# Patient Record
Sex: Female | Born: 1975
Health system: Southern US, Community
[De-identification: ages and names within clinical notes are randomized; demographics above are authoritative.]

## PROBLEM LIST (undated history)

## (undated) DIAGNOSIS — T7840XA Allergy, unspecified, initial encounter: Secondary | ICD-10-CM

## (undated) DIAGNOSIS — D6851 Activated protein C resistance: Secondary | ICD-10-CM

## (undated) DIAGNOSIS — T4145XA Adverse effect of unspecified anesthetic, initial encounter: Secondary | ICD-10-CM

## (undated) DIAGNOSIS — I7300 Raynaud's syndrome without gangrene: Secondary | ICD-10-CM

## (undated) DIAGNOSIS — F419 Anxiety disorder, unspecified: Secondary | ICD-10-CM

## (undated) DIAGNOSIS — F329 Major depressive disorder, single episode, unspecified: Secondary | ICD-10-CM

## (undated) DIAGNOSIS — F32A Depression, unspecified: Secondary | ICD-10-CM

## (undated) DIAGNOSIS — E162 Hypoglycemia, unspecified: Secondary | ICD-10-CM

## (undated) DIAGNOSIS — T8859XA Other complications of anesthesia, initial encounter: Secondary | ICD-10-CM

## (undated) DIAGNOSIS — K900 Celiac disease: Secondary | ICD-10-CM

## (undated) DIAGNOSIS — F909 Attention-deficit hyperactivity disorder, unspecified type: Secondary | ICD-10-CM

## (undated) DIAGNOSIS — M797 Fibromyalgia: Secondary | ICD-10-CM

## (undated) HISTORY — PX: FOOT SURGERY: SHX648

## (undated) HISTORY — DX: Raynaud's syndrome without gangrene: I73.00

## (undated) HISTORY — DX: Allergy, unspecified, initial encounter: T78.40XA

## (undated) HISTORY — DX: Celiac disease: K90.0

## (undated) HISTORY — DX: Anxiety disorder, unspecified: F41.9

## (undated) HISTORY — DX: Activated protein C resistance: D68.51

## (undated) HISTORY — DX: Depression, unspecified: F32.A

## (undated) HISTORY — DX: Major depressive disorder, single episode, unspecified: F32.9

## (undated) HISTORY — DX: Attention-deficit hyperactivity disorder, unspecified type: F90.9

## (undated) HISTORY — DX: Fibromyalgia: M79.7

## (undated) HISTORY — DX: Hypoglycemia, unspecified: E16.2

---

## 2008-03-06 ENCOUNTER — Ambulatory Visit: Payer: Self-pay | Admitting: Oncology

## 2008-03-08 ENCOUNTER — Ambulatory Visit: Payer: Self-pay | Admitting: Oncology

## 2008-03-08 IMAGING — US US EXTREM LOW VENOUS*R*
1 series · 17 of 24 positions shown · non-contrast
Comparison: none

REASON FOR EXAM: Right leg pain, history of DVT
           Call report to: [[email or web address]]
COMMENTS:

[Series 1: us extrem low venous*right* · 17 of 26 slices shown]
[im 1/26]
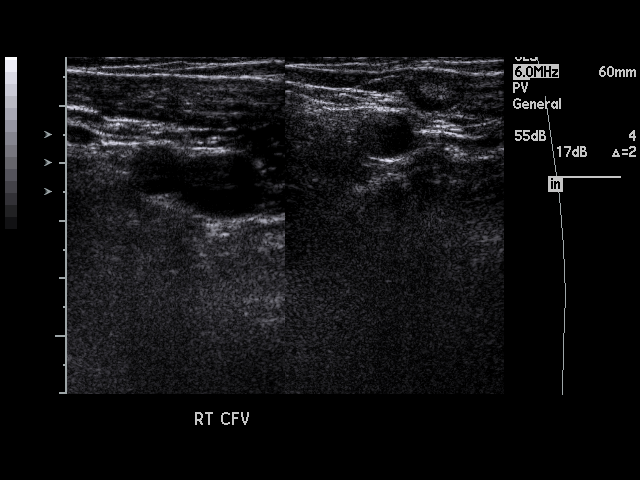
[im 3/26]
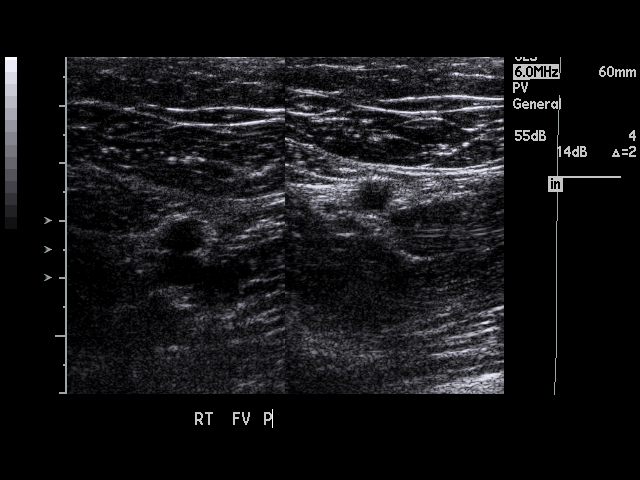
[im 4/26]
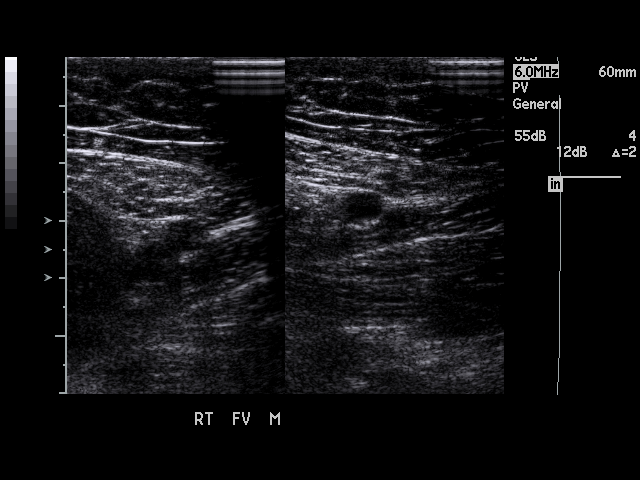
[im 5/26]
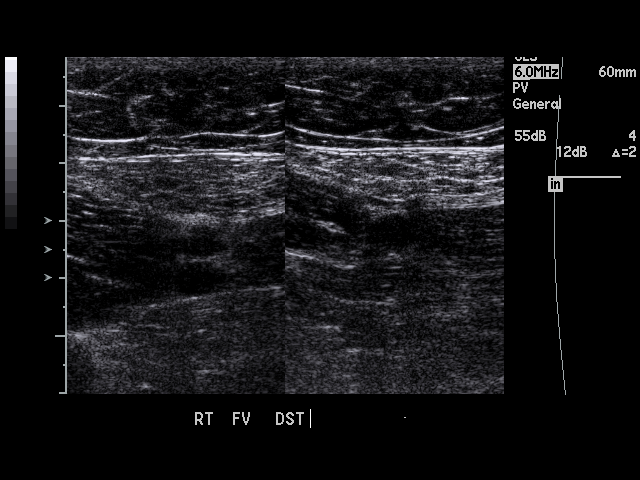
[im 7/26]
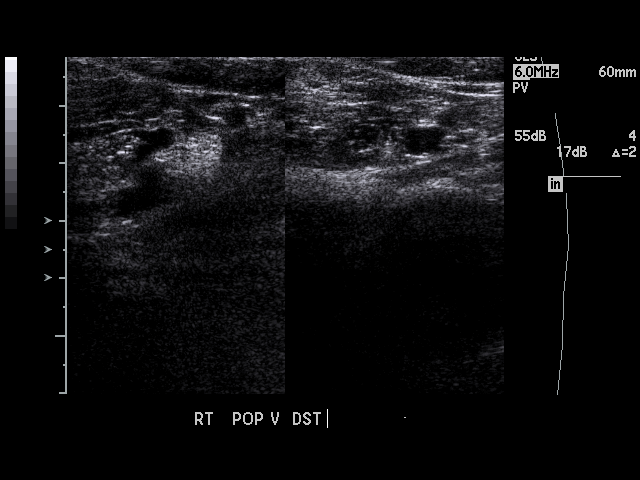
[im 8/26]
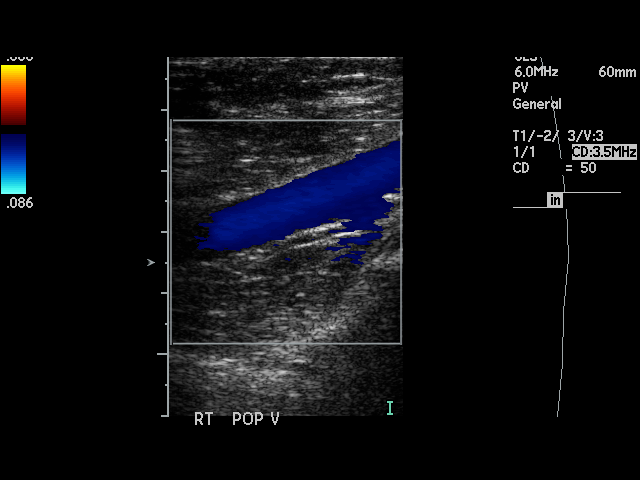
[im 10/26]
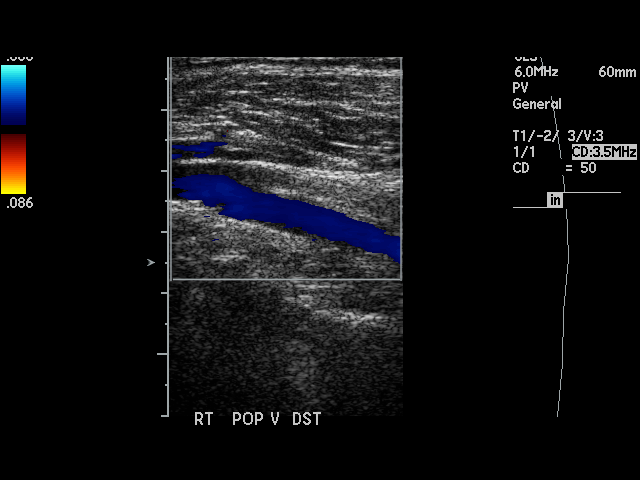
[im 11/26]
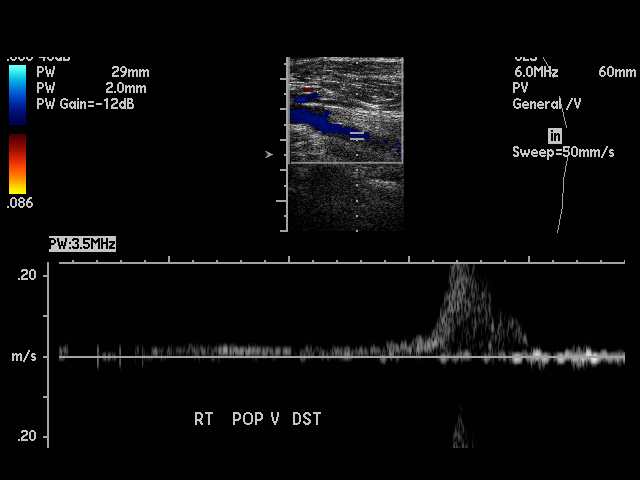
[im 14/26]
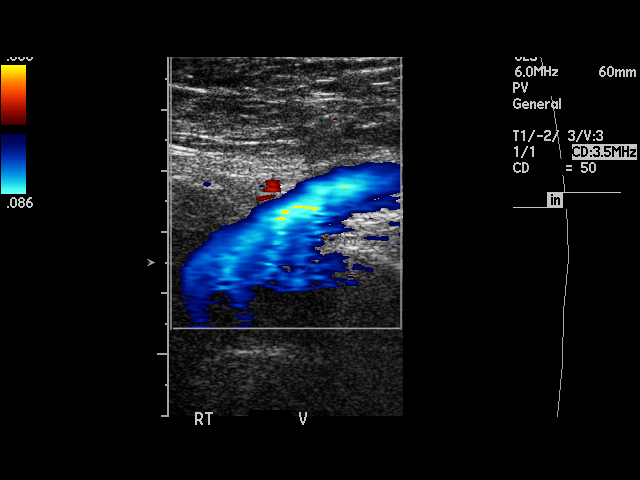
[im 15/26]
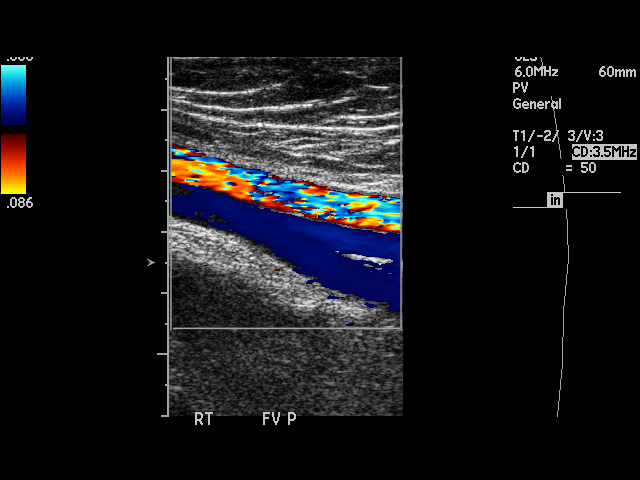
[im 16/26]
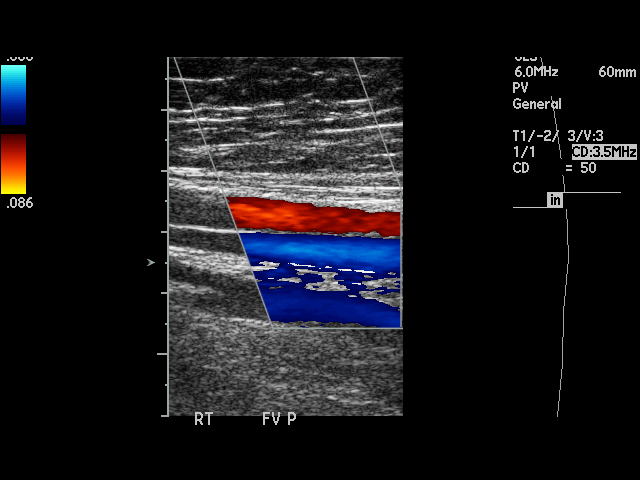
[im 18/26]
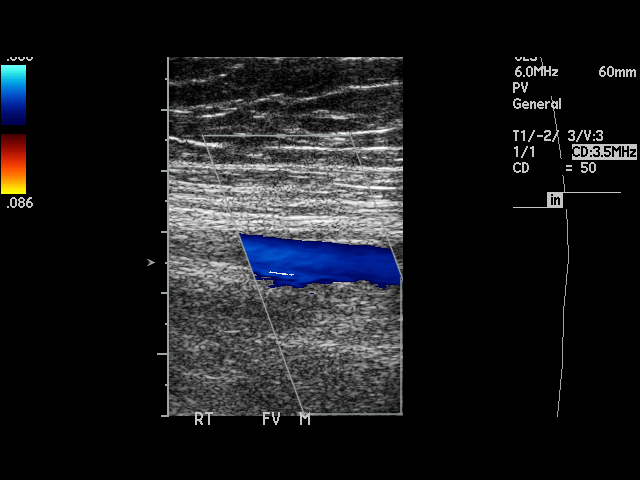
[im 19/26]
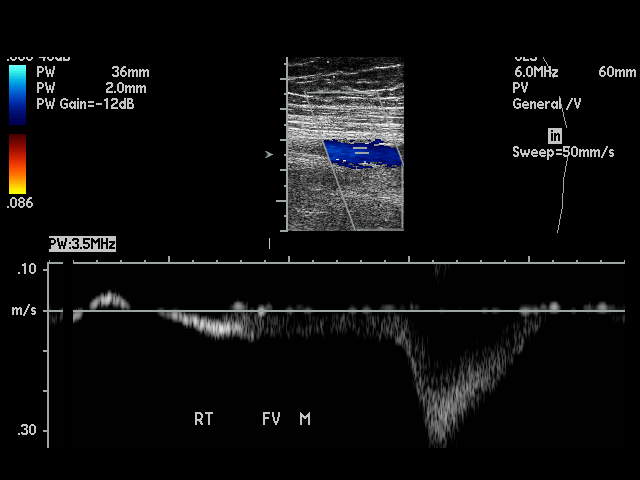
[im 21/26]
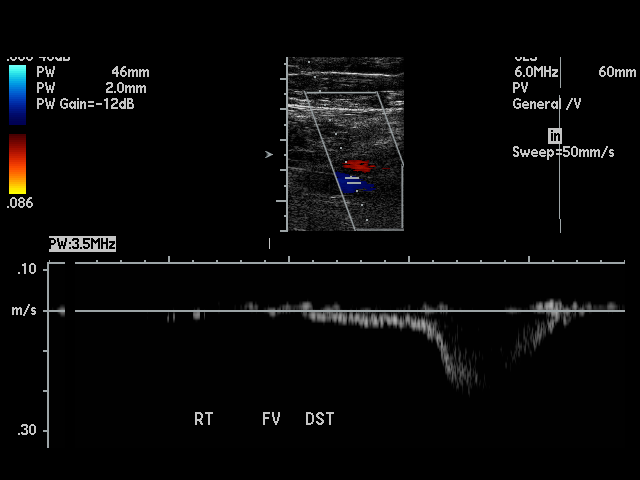
[im 22/26]
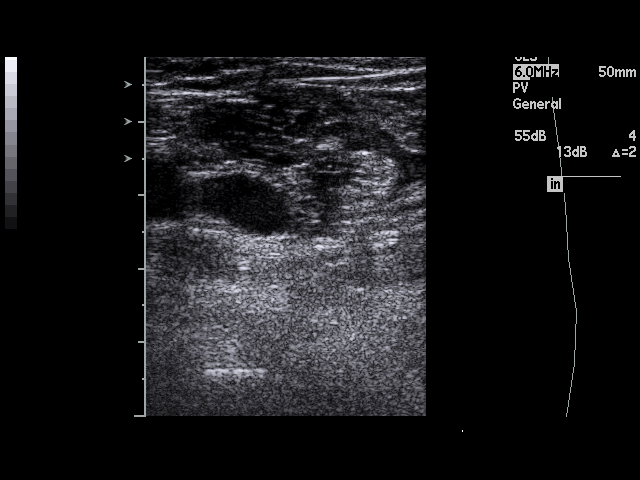
[im 23/26]
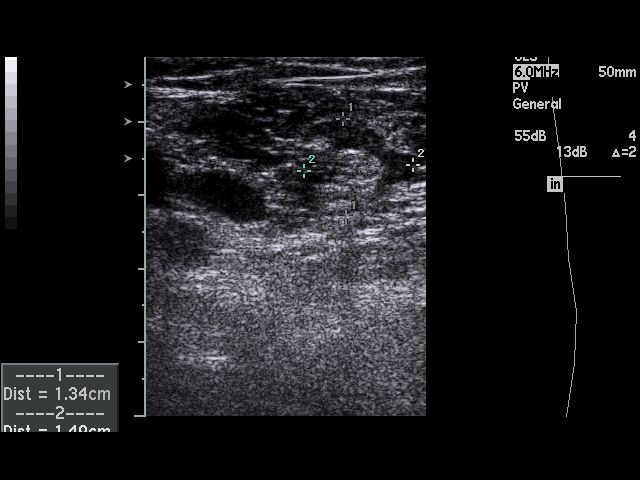
[im 26/26]
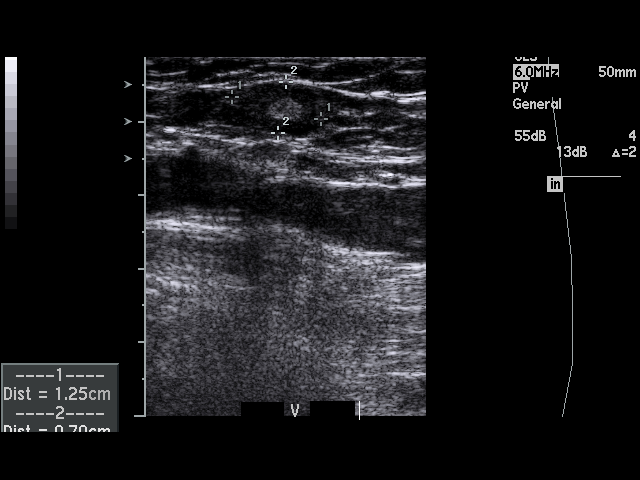

[17 of 24 positions shown; findings below may reference images not displayed]

PROCEDURE:     US  - US DOPPLER LOW EXTR RIGHT  - [DATE]  [DATE]

RESULT:     The RIGHT femoral and popliteal veins are normally compressible.
The waveform patterns are normal and the color flow images are normal. There
are borderline enlarged lymph nodes noted adjacent to the common femoral and
saphenous veins with the largest measuring approximately 2.0 cm in diameter.
IMPRESSION: 1.     There is no evidence of deep venous thrombosis on the RIGHT in the
lower extremity.
2.     There are borderline enlarged lymph nodes identified.

This report was called to Dr. [REDACTED] at the conclusion of the
study.

## 2008-04-02 ENCOUNTER — Ambulatory Visit: Payer: Self-pay | Admitting: Oncology

## 2008-08-30 ENCOUNTER — Ambulatory Visit: Payer: Self-pay | Admitting: Oncology

## 2008-09-02 ENCOUNTER — Ambulatory Visit: Payer: Self-pay | Admitting: Oncology

## 2008-09-13 ENCOUNTER — Ambulatory Visit: Payer: Self-pay | Admitting: Oncology

## 2008-09-30 ENCOUNTER — Ambulatory Visit: Payer: Self-pay | Admitting: Oncology

## 2008-12-31 ENCOUNTER — Ambulatory Visit: Payer: Self-pay | Admitting: Internal Medicine

## 2009-01-20 ENCOUNTER — Ambulatory Visit: Payer: Self-pay | Admitting: Internal Medicine

## 2009-01-22 IMAGING — US ABDOMEN ULTRASOUND
1 series · 17 of 25 positions shown · non-contrast
Comparison: none

REASON FOR EXAM: severe neutropenia
COMMENTS:

PROCEDURE:     [facility] - K[facility]MA ABDOMEN GENERAL SURVEY  - [DATE]  [DATE]
RESULT:     Comparison: None
TECHNIQUE: Multiple gray-scale and color-flow Doppler images of the abdomen
are presented for review.

[Series 1: abdomen ultrasound · 17 of 77 slices shown]
[im 1/77]
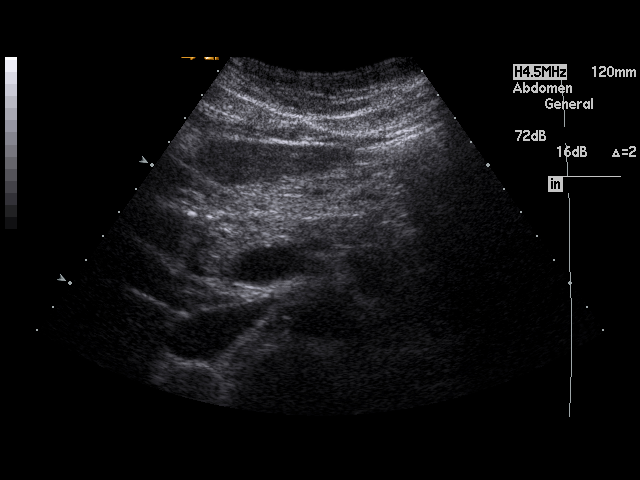
[im 7/77]
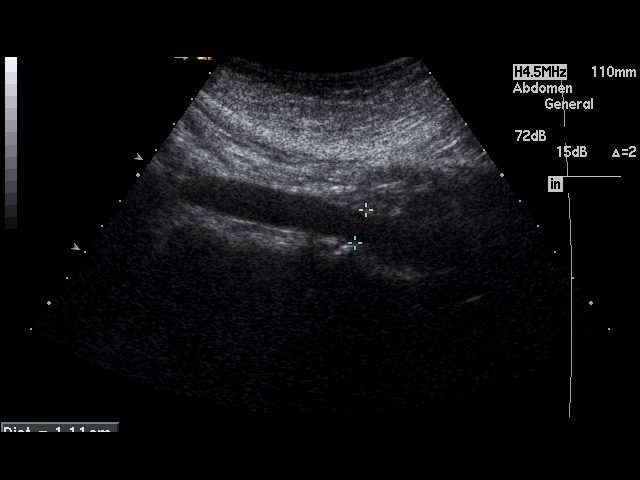
[im 10/77]
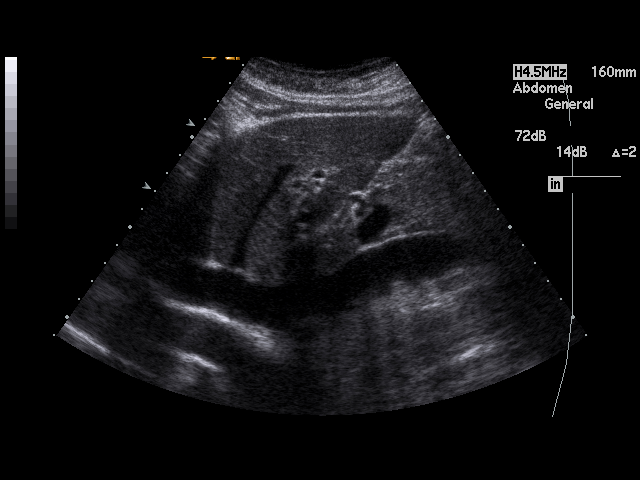
[im 16/77]
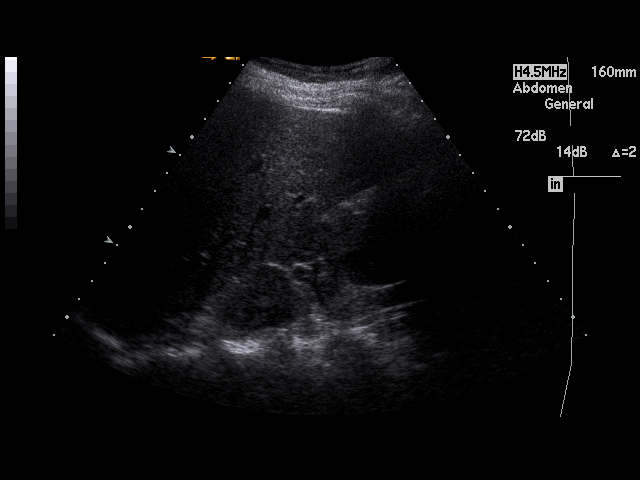
[im 20/77]
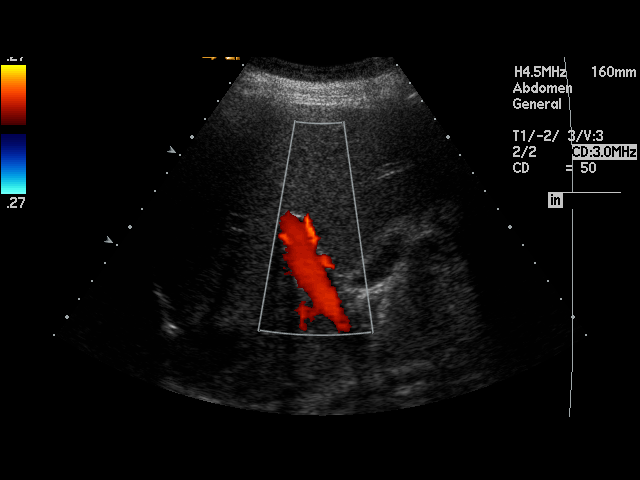
[im 26/77]
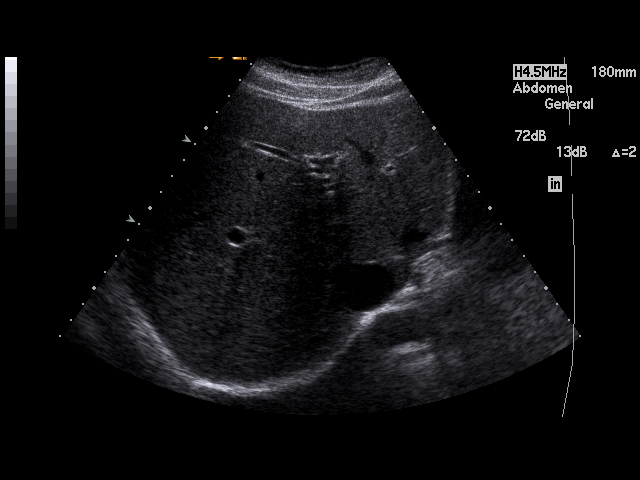
[im 29/77]
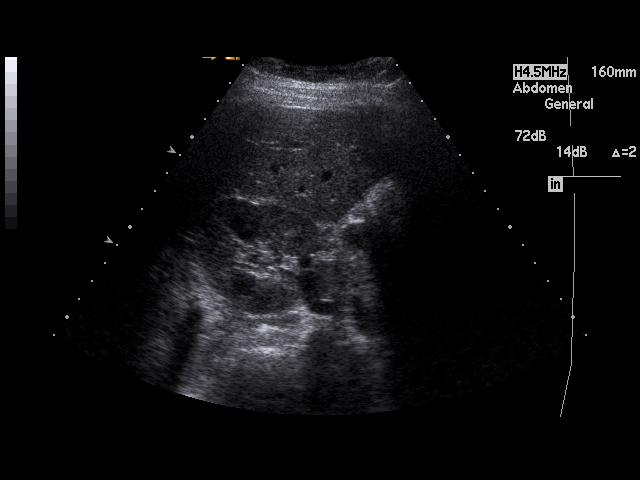
[im 35/77]
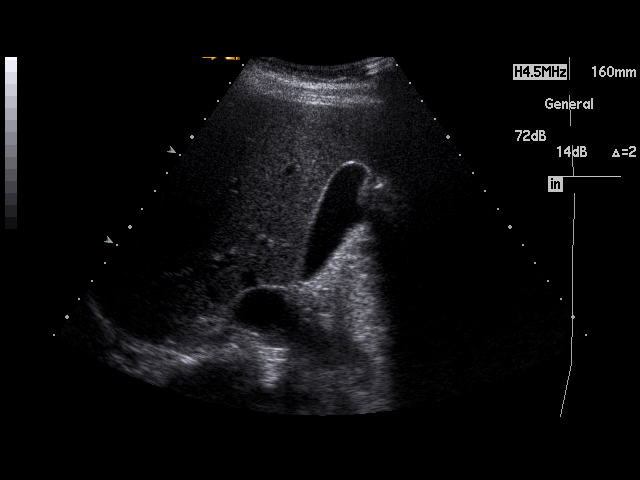
[im 39/77]
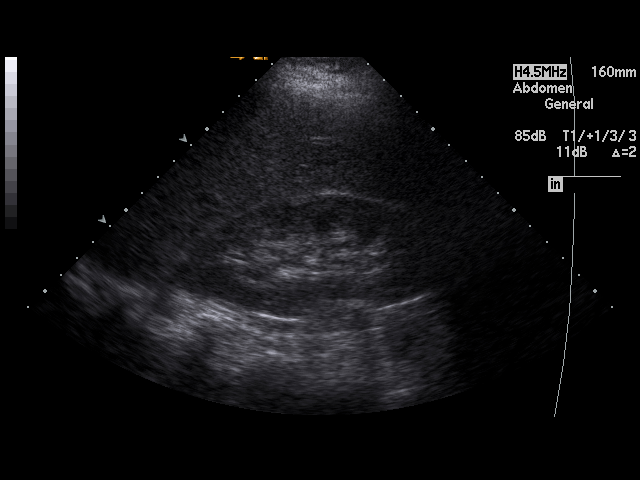
[im 42/77]
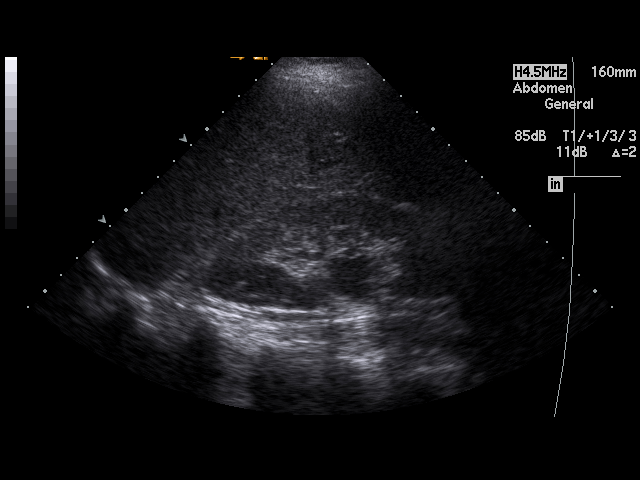
[im 48/77]
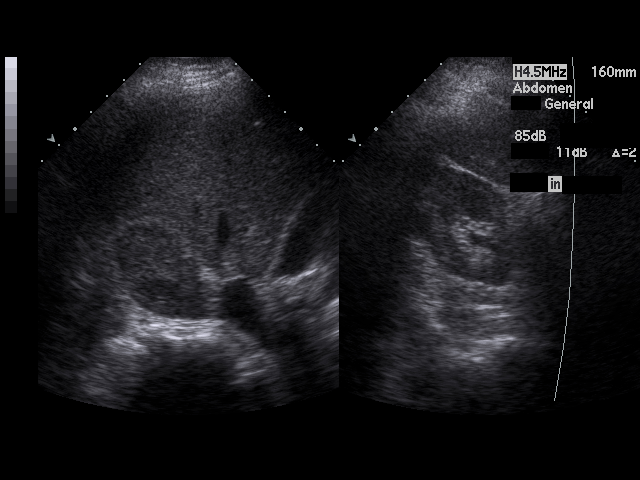
[im 51/77]
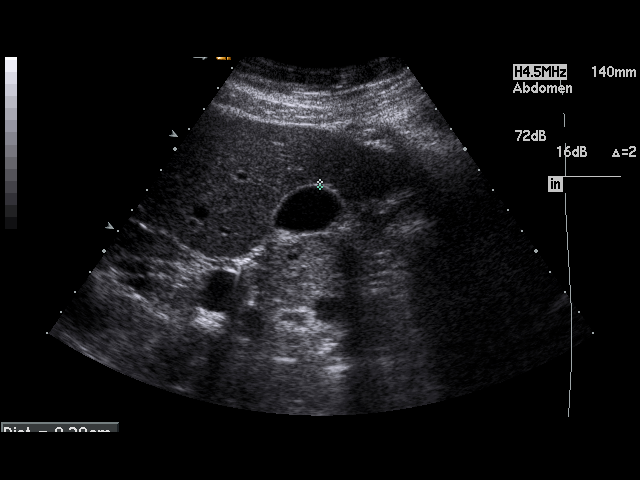
[im 58/77]
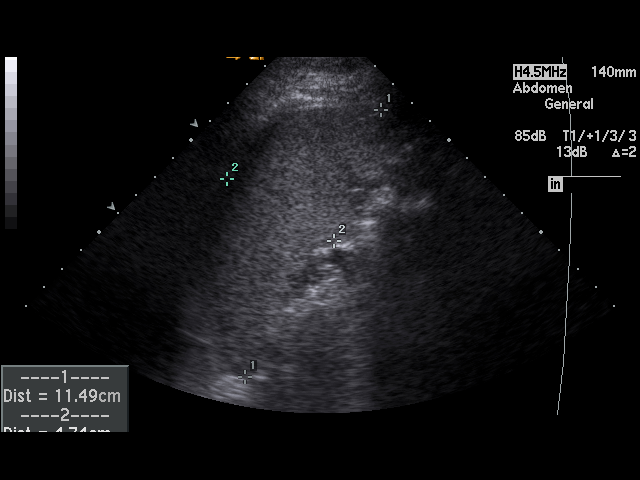
[im 61/77]
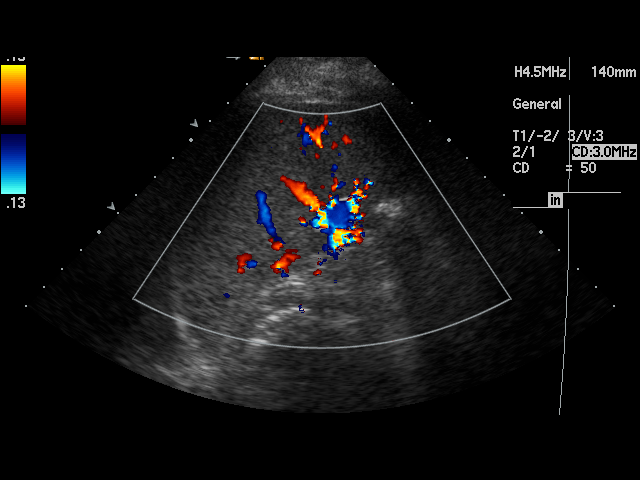
[im 67/77]
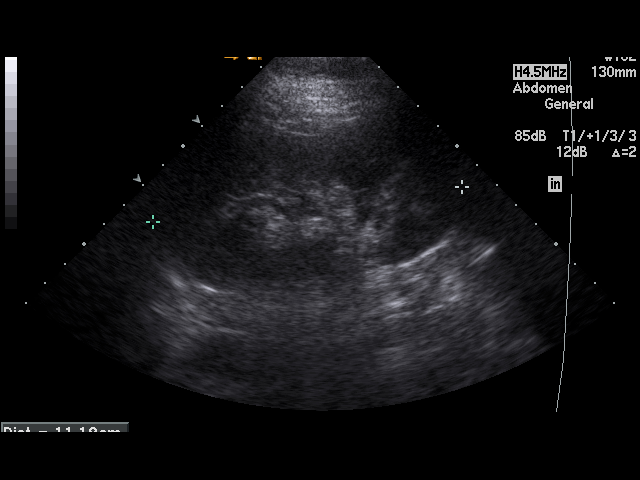
[im 70/77]
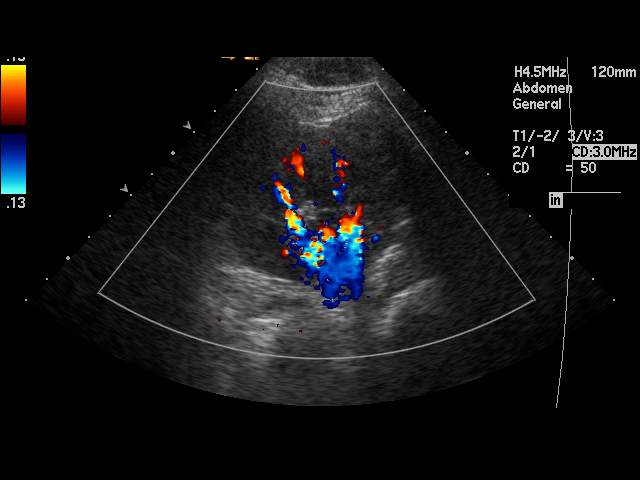
[im 77/77]
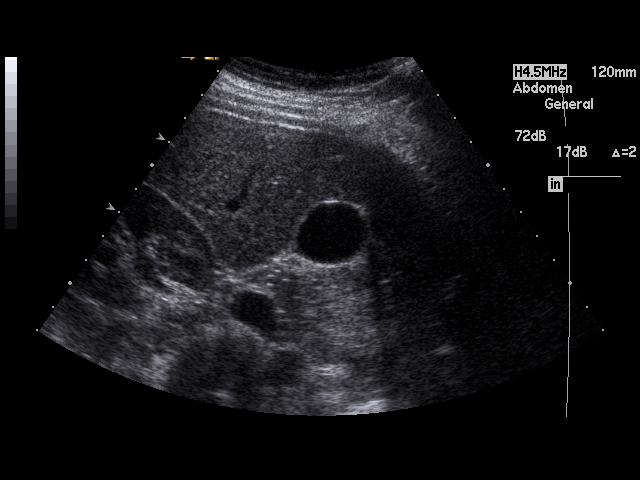

[17 of 25 positions shown; findings below may reference images not displayed]

FINDINGS: Visualized portions of the liver demonstrate normal echogenicity and normal
contours. The liver is without evidence of  focal hepatic lesion.

There is no cholelithiasis or biliary sludge. There is no intra- or
extrahepatic biliary ductal dilatation. The common duct measures 4.9 mm in
maximal diameter. There is no gallbladder wall thickening, pericholecystic
fluid, or sonographic Murphy's sign.

The visualized portion of the pancreas is normal in echogenicity. The spleen
is unremarkable. Bilateral kidneys are normal in echogenicity and size. The
right kidney measures 12.4 cm. The left kidney measures 11.2 cm. There are
no renal calculi or hydronephrosis. The abdominal aorta and IVC are
unremarkable.
IMPRESSION: Normal abdominal ultrasound.

## 2009-01-30 ENCOUNTER — Ambulatory Visit: Payer: Self-pay | Admitting: Internal Medicine

## 2009-03-02 ENCOUNTER — Ambulatory Visit: Payer: Self-pay | Admitting: Internal Medicine

## 2009-03-10 ENCOUNTER — Ambulatory Visit: Payer: Self-pay | Admitting: Internal Medicine

## 2009-04-02 ENCOUNTER — Ambulatory Visit: Payer: Self-pay | Admitting: Internal Medicine

## 2009-04-15 ENCOUNTER — Ambulatory Visit: Payer: Self-pay | Admitting: Internal Medicine

## 2009-05-02 ENCOUNTER — Ambulatory Visit: Payer: Self-pay | Admitting: Internal Medicine

## 2012-01-21 DIAGNOSIS — D6851 Activated protein C resistance: Secondary | ICD-10-CM | POA: Insufficient documentation

## 2013-06-22 ENCOUNTER — Ambulatory Visit: Payer: Self-pay | Admitting: Internal Medicine

## 2013-07-12 ENCOUNTER — Ambulatory Visit: Payer: Self-pay | Admitting: Internal Medicine

## 2013-08-01 ENCOUNTER — Encounter: Payer: Self-pay | Admitting: *Deleted

## 2013-08-08 ENCOUNTER — Ambulatory Visit: Payer: Self-pay | Admitting: Internal Medicine

## 2013-08-08 ENCOUNTER — Telehealth: Payer: Self-pay | Admitting: *Deleted

## 2013-08-08 NOTE — Telephone Encounter (Signed)
called to see if she was coming to appt. she forgot. she will reschedule.

## 2013-08-30 DIAGNOSIS — R1310 Dysphagia, unspecified: Secondary | ICD-10-CM | POA: Insufficient documentation

## 2013-08-30 DIAGNOSIS — R634 Abnormal weight loss: Secondary | ICD-10-CM | POA: Insufficient documentation

## 2013-08-30 DIAGNOSIS — R1319 Other dysphagia: Secondary | ICD-10-CM | POA: Insufficient documentation

## 2013-09-06 ENCOUNTER — Ambulatory Visit: Payer: Self-pay | Admitting: Internal Medicine

## 2015-02-25 ENCOUNTER — Encounter: Payer: Self-pay | Admitting: Family Medicine

## 2015-02-25 NOTE — Progress Notes (Signed)
Patient was last seen at [facility] September 11, 2014 She should have been seen for f/u 12 weeks later [facility] reviewed Last clonazepam was filled on Dec 13, 2014, #42 pills She will need an appointment for any further controlled substance prescriptions Dr. Sanda Klein

## 2015-03-07 ENCOUNTER — Ambulatory Visit (INDEPENDENT_AMBULATORY_CARE_PROVIDER_SITE_OTHER): Payer: Self-pay | Admitting: Family Medicine

## 2015-03-07 ENCOUNTER — Encounter: Payer: Self-pay | Admitting: Family Medicine

## 2015-03-07 VITALS — BP 111/75 | HR 84 | Temp 99.4°F | Ht 67.5 in | Wt 138.4 lb

## 2015-03-07 DIAGNOSIS — H6091 Unspecified otitis externa, right ear: Secondary | ICD-10-CM

## 2015-03-07 DIAGNOSIS — H9311 Tinnitus, right ear: Secondary | ICD-10-CM

## 2015-03-07 DIAGNOSIS — F411 Generalized anxiety disorder: Secondary | ICD-10-CM

## 2015-03-07 MED ORDER — CIPROFLOXACIN-DEXAMETHASONE 0.3-0.1 % OT SUSP: 4.0000 [drp] | Freq: Two times a day (BID) | OTIC | Status: DC

## 2015-03-07 MED ORDER — CLONAZEPAM 1 MG PO TABS: 1.0000 mg | ORAL_TABLET | ORAL | Status: DC | PRN

## 2015-03-07 MED ORDER — NEOMYCIN-POLYMYXIN-HC 3.5-10000-1 OT SOLN: 4.0000 [drp] | Freq: Four times a day (QID) | OTIC | Status: DC

## 2015-03-07 NOTE — Progress Notes (Signed)
BP 111/75 mmHg  Pulse 84  Temp(Src) 99.4 F (37.4 C)  Ht 5' 7.5" (1.715 m)  Wt 138 lb 6.4 oz (62.778 kg)  BMI 21.34 kg/m2  SpO2 99%  LMP 02/16/2015 (Exact Date)   Subjective:    Patient ID: Diana Daniels, female    DOB: 1975-12-30, 39 y.o.   MRN: [id number]  HPI: Diana Daniels is a 39 y.o. female  Chief Complaint  Patient presents with  . Anxiety    Patient would like her Klonopin increased back to two a day  . Ear Pain    right ear, muffled sound and pitch. Was cleaning her ear before this started and there was a lot of blood, she did not jam the Qtip into her ear. Has a hx of rutured ear drum 20 years ago   EAG CLOGGED, has been having a high pitched noise in her ear, constant for the last week and a half Duration: 1.5 weeks Involved ear(s):  "right Sensation of feeling clogged/plugged: yes Decreased/muffled hearing:yes Ear pain: yes only occasionally Fever: no Otorrhea: yes Hearing loss: no Upper respiratory infection symptoms: no Using Q-Tips: yes Status: worse History of cerumenosis: yes Treatments attempted: pseudoephedrine, benadryl  Feels like she is always anxious. She going to a psychologist. She is going to see a psychiatrist but her appointment is several weeks off. She has been very anxious and it gets worse when she comes here, especially seeing her provider.  ANXIETY/STRESS Duration:uncontrolled, chronic Anxious mood: yes  Excessive worrying: yes Irritability: no  Sweating: yes Nausea: yes Palpitations:yes Hyperventilation: yes Panic attacks: yes Agoraphobia: yes  Obscessions/compulsions: yes Depressed mood: no GAD7: 20  Relevant past medical, surgical, family and social history reviewed and updated as indicated. Interim medical history since our last visit reviewed. Allergies and medications reviewed and updated.  Review of Systems  Constitutional: Negative.   HENT: Positive for ear discharge, ear pain and hearing loss. Negative for  congestion, dental problem, drooling, facial swelling, mouth sores, nosebleeds, postnasal drip, rhinorrhea, sinus pressure, sneezing, sore throat, tinnitus, trouble swallowing and voice change.   Respiratory: Negative.   Cardiovascular: Negative.   Psychiatric/Behavioral: Negative for suicidal ideas, hallucinations, behavioral problems, confusion, sleep disturbance, self-injury, dysphoric mood, decreased concentration and agitation. The patient is nervous/anxious. The patient is not hyperactive.     Per HPI unless specifically indicated above     Objective:    BP 111/75 mmHg  Pulse 84  Temp(Src) 99.4 F (37.4 C)  Ht 5' 7.5" (1.715 m)  Wt 138 lb 6.4 oz (62.778 kg)  BMI 21.34 kg/m2  SpO2 99%  LMP 02/16/2015 (Exact Date)  Wt Readings from Last 3 Encounters:  03/07/15 138 lb 6.4 oz (62.778 kg)    Hearing Screening   125Hz  250Hz  500Hz  1000Hz  2000Hz  4000Hz  8000Hz   Right ear:   20 20 20 20    Left ear:   25 25 25 25        Physical Exam  Constitutional: She is oriented to person, place, and time. She appears well-developed and well-nourished. No distress.  HENT:  Head: Normocephalic and atraumatic.  Right Ear: Hearing normal. No lacerations. There is swelling. No drainage or tenderness. No foreign bodies. No mastoid tenderness. Tympanic membrane is not injected. Tympanic membrane mobility is normal. No middle ear effusion. No hemotympanum. No decreased hearing is noted.  Left Ear: Hearing and external ear normal.  Nose: Nose normal.  Mouth/Throat: Oropharynx is clear and moist. No oropharyngeal exudate.  Swelling and some pus in R  EAC  Eyes: Conjunctivae and lids are normal. Right eye exhibits no discharge. Left eye exhibits no discharge. No scleral icterus.  Cardiovascular: Normal rate, regular rhythm and normal heart sounds.  Exam reveals no gallop and no friction rub.   No murmur heard. Pulmonary/Chest: Effort normal and breath sounds normal. No respiratory distress. She has no  wheezes. She has no rales. She exhibits no tenderness.  Musculoskeletal: Normal range of motion.  Neurological: She is alert and oriented to person, place, and time.  Skin: Skin is intact. No rash noted.  Psychiatric: She has a normal mood and affect. Her speech is normal and behavior is normal. Judgment and thought content normal. Cognition and memory are normal.  Nursing note and vitals reviewed.   No results found for this or any previous visit.    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 Stable on her medicine, has been halving it to make it last. Uncomfortable seeing previous provider, feels like it increases her anxiety. Will return to other provider. Will refill medicine for 1 month. Rx given today. Continue to follow with specialists.        Other Visit Diagnoses    Otitis externa, right    -  Primary    Rx for ciprodex given today. Call if not improving or worsening.     Tinnitus, right        Possibly due to otitis externa. Will treat. If not improving, let us know, will do further work up.         Follow up plan: Return in about 4 weeks (around 04/04/2015) for With [facility] for follow up anxiety.

## 2015-03-07 NOTE — Patient Instructions (Signed)
Tinnitus °Sounds you hear in your ears and coming from within the ear is called tinnitus. This can be a symptom of many ear disorders. It is often associated with hearing loss.  °Tinnitus can be seen with: °· Infections. °· Ear blockages such as wax buildup. °· Meniere's disease. °· Ear damage. °· Inherited. °· Occupational causes. °While irritating, it is not usually a threat to health. When the cause of the tinnitus is wax, infection in the middle ear, or foreign body it is easily treated. Hearing loss will usually be reversible.  °TREATMENT  °When treating the underlying cause does not get rid of tinnitus, it may be necessary to get rid of the unwanted sound by covering it up with more pleasant background noises. This may include music, the radio etc. There are tinnitus maskers which can be worn which produce background noise to cover up the tinnitus. °Avoid all medications which tend to make tinnitus worse such as alcohol, caffeine, aspirin, and nicotine. There are many soothing background tapes such as rain, ocean, thunderstorms, etc. These soothing sounds help with sleeping or resting. °Keep all follow-up appointments and referrals. This is important to identify the cause of the problem. It also helps avoid complications, impaired hearing, disability, or chronic pain. °Document Released: 07/19/2005 Document Revised: 10/11/2011 Document Reviewed: 03/06/2008 °ExitCare® Patient Information ©2015 [employer]. This information is not intended to replace advice given to you by your health care provider. Make sure you discuss any questions you have with your health care provider. ° °

## 2015-03-07 NOTE — Addendum Note (Signed)
Addended by: Valerie Roys on: 03/07/2015 02:09 PM   Modules accepted: Orders, Medications

## 2015-03-07 NOTE — Assessment & Plan Note (Addendum)
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 Stable on her medicine, has been halving it to make it last. Uncomfortable seeing previous provider, feels like it increases her anxiety. Will return to other provider. Will refill medicine for 1 month. Rx given today. Continue to follow with specialists.

## 2015-04-04 ENCOUNTER — Ambulatory Visit: Payer: Self-pay | Admitting: Family Medicine

## 2015-11-17 IMAGING — US US ABDOMEN COMPLETE
1 series · 14 of 25 positions shown · non-contrast
Comparison: Ultrasound [DATE] .

CLINICAL DATA: Leukopenia.

EXAM:
ABDOMEN ULTRASOUND COMPLETE

[Series 1: us abdomen complete · 0.20mm/px · 14 of 114 slices shown]
[im 1/114]
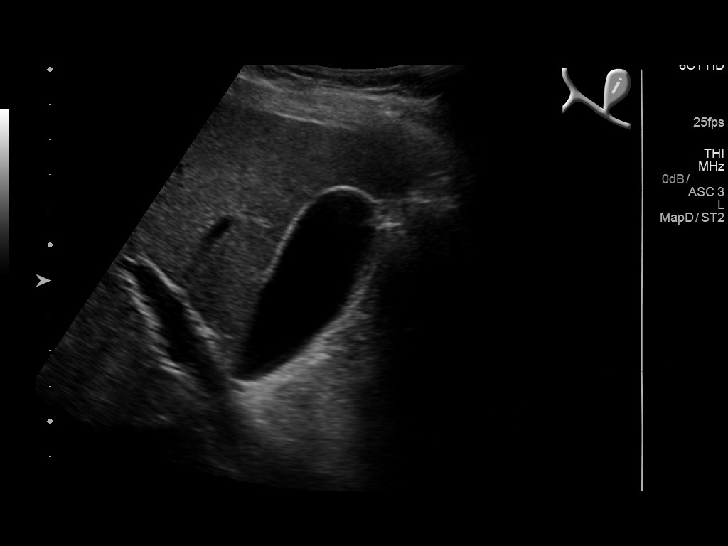
[im 10/114]
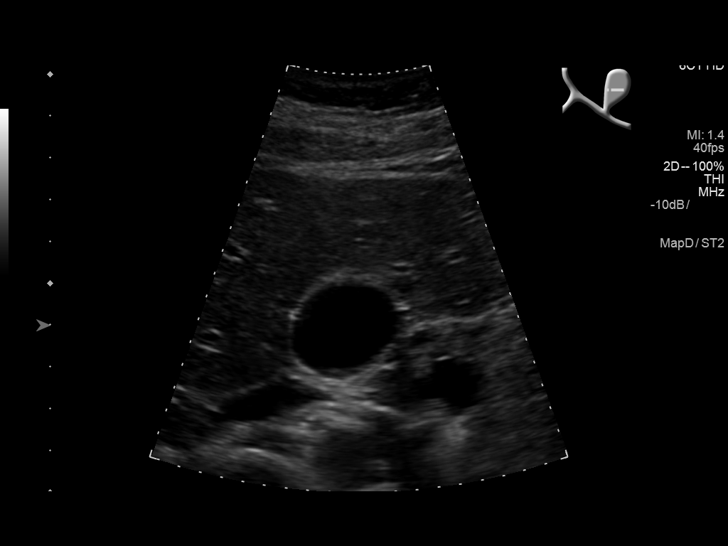
[im 19/114]
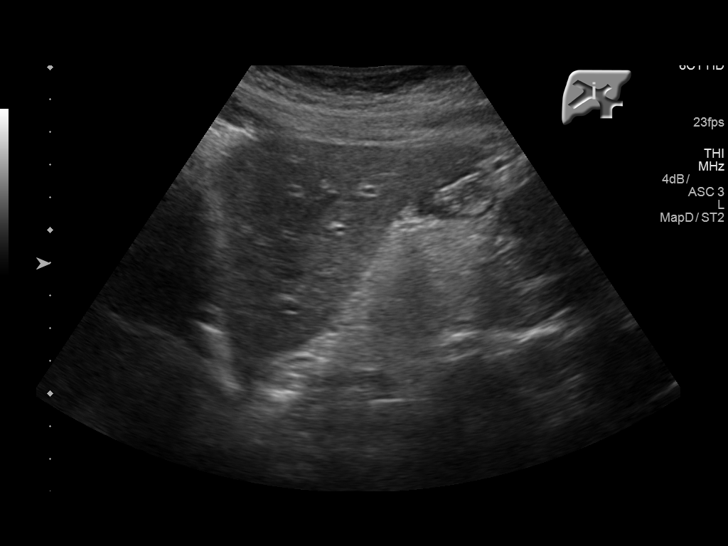
[im 29/114]
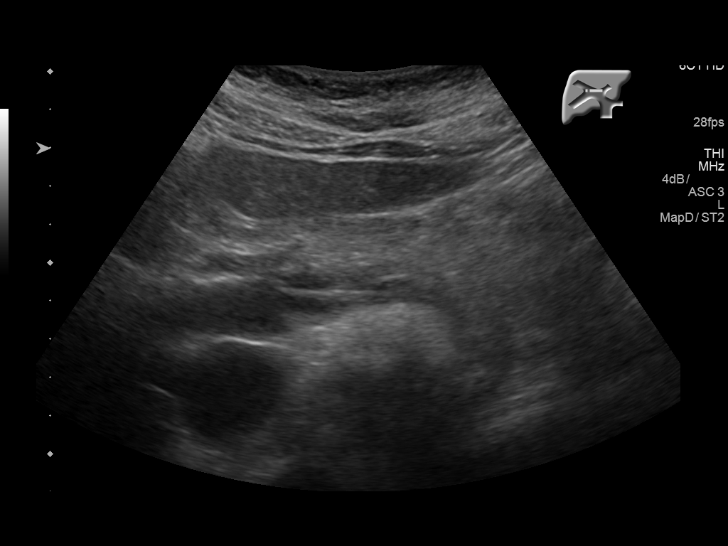
[im 38/114]
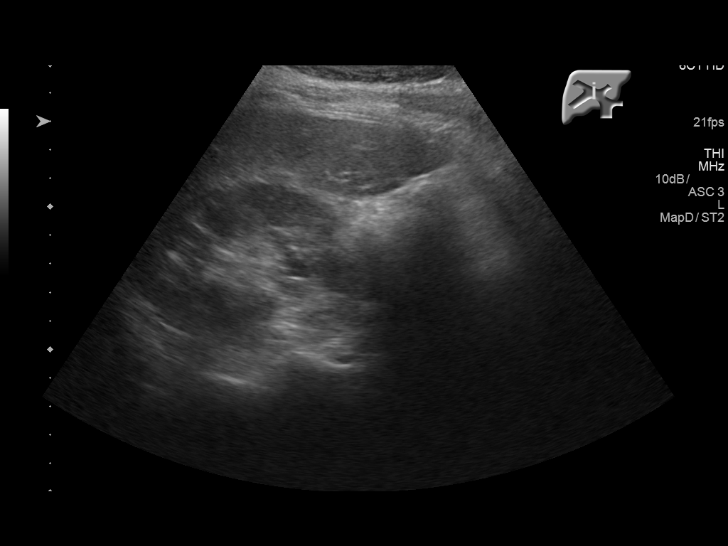
[im 43/114]
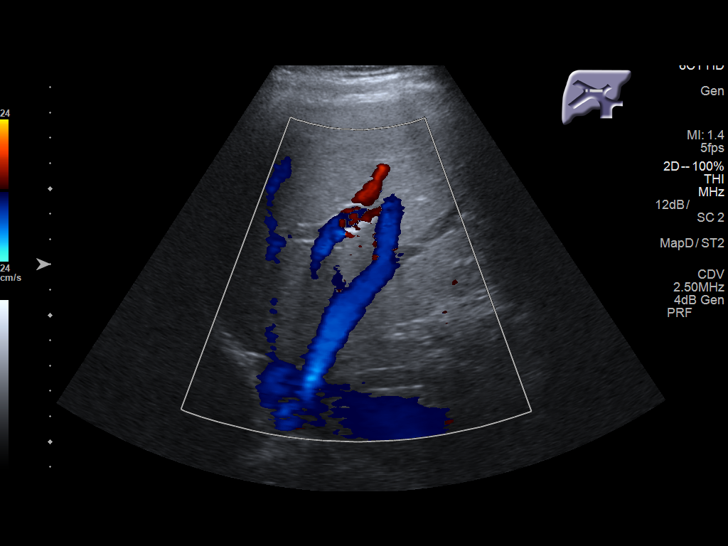
[im 52/114]
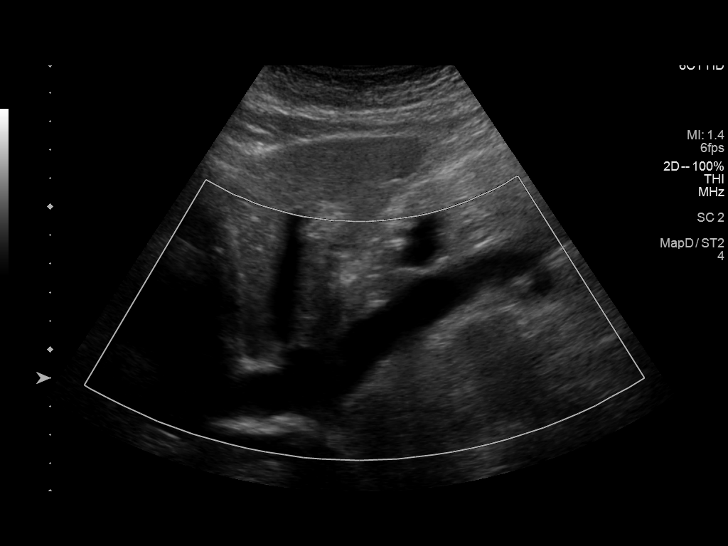
[im 62/114]
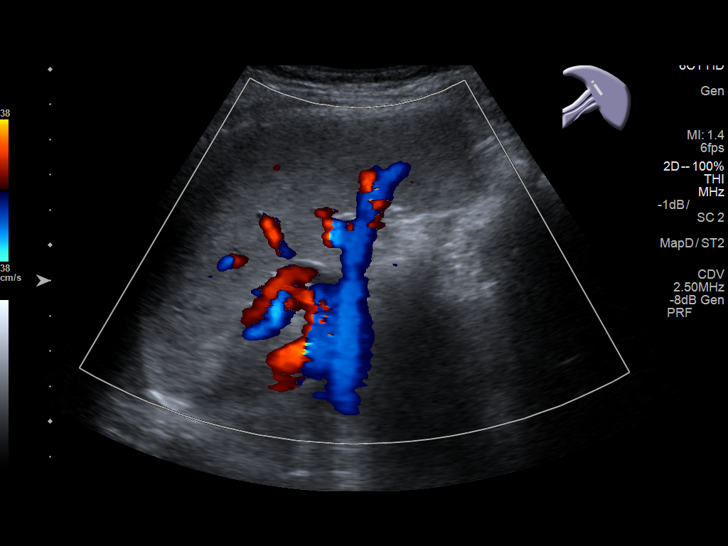
[im 71/114]
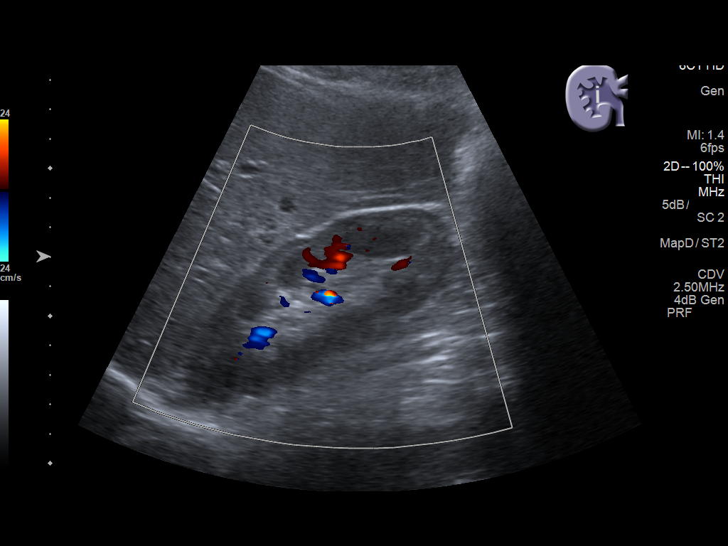
[im 76/114]
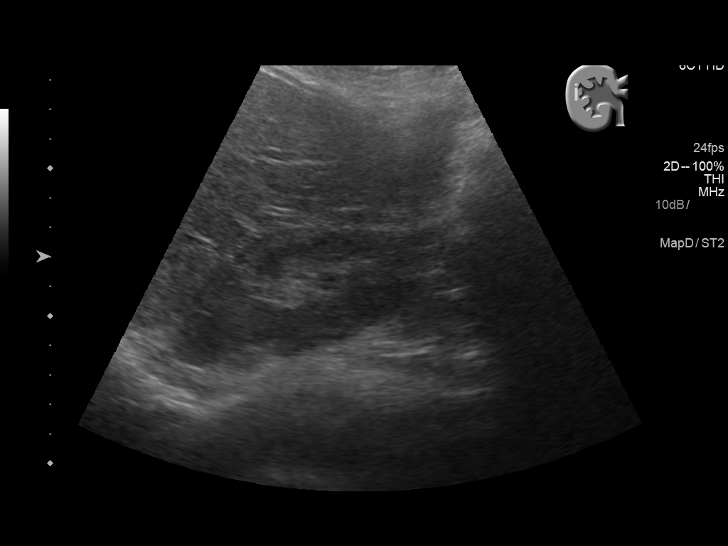
[im 85/114]
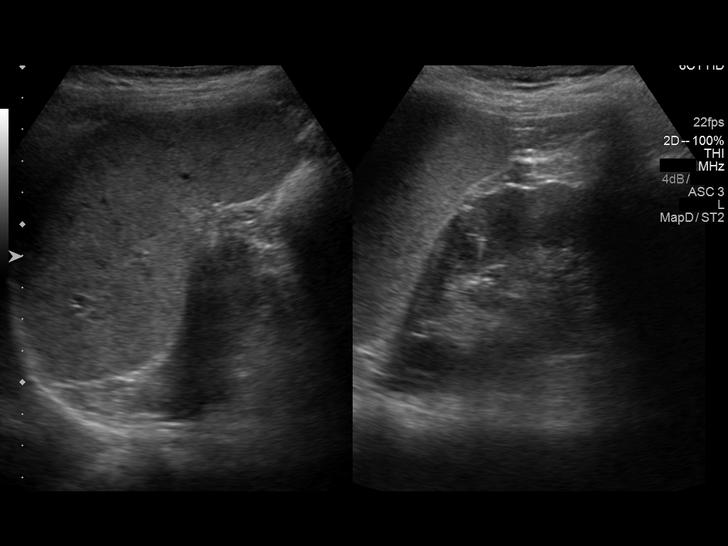
[im 95/114]
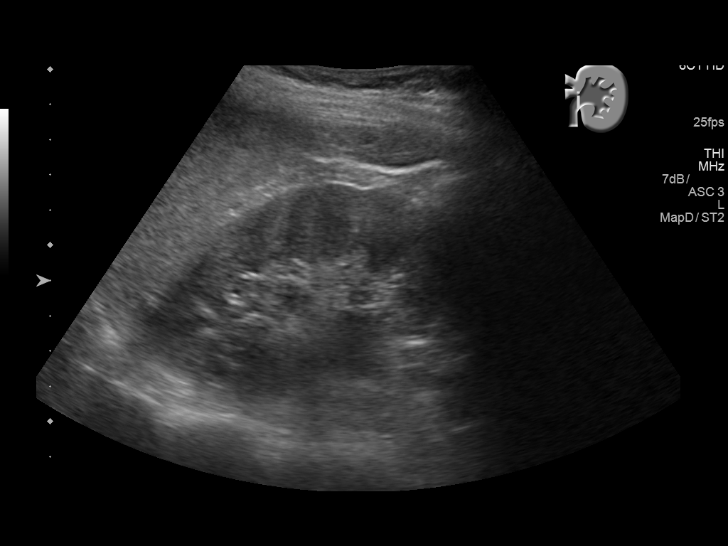
[im 104/114]
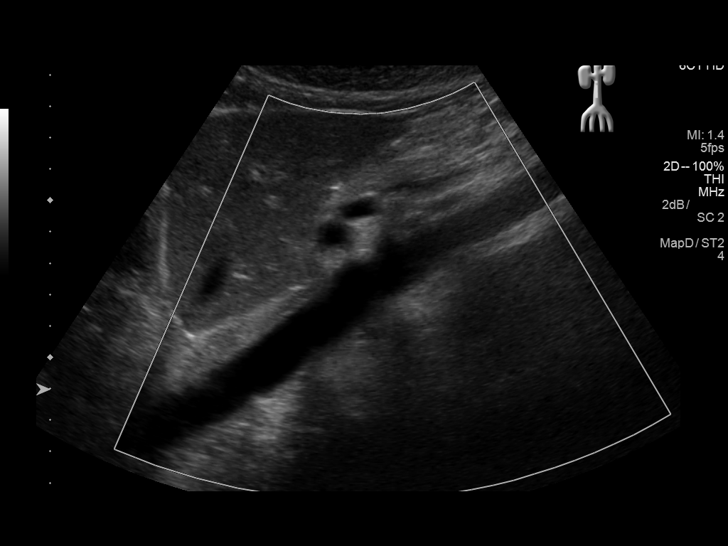
[im 114/114]
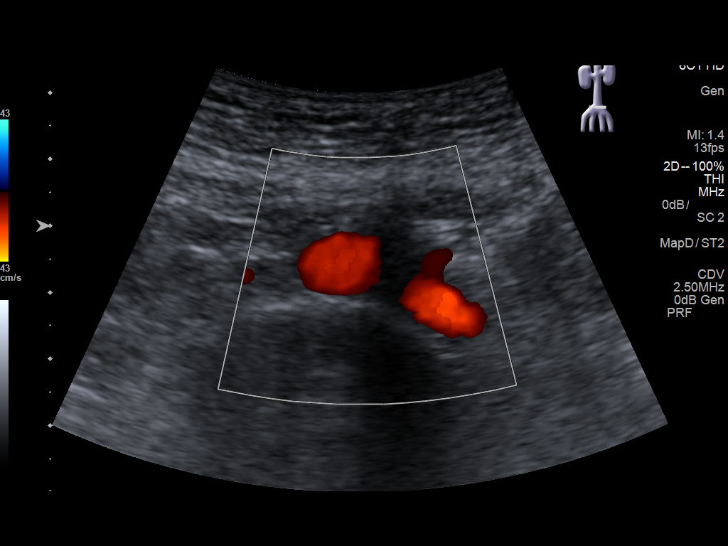

[14 of 25 positions shown; findings below may reference images not displayed]

FINDINGS: Gallbladder: No gallstones or wall thickening visualized. No
sonographic Murphy sign noted by sonographer.

Common bile duct: Diameter: 4 mm

Liver: No focal lesion identified. Within normal limits in
parenchymal echogenicity.

IVC: No abnormality visualized.

Pancreas: Visualized portion unremarkable.

Spleen: Size and appearance within normal limits. Spleen measures
9.4 cm.

Right Kidney: Length: 10.9 cm. Echogenicity within normal limits. No
mass or hydronephrosis visualized.

Left Kidney: Length: 10.3 cm. Echogenicity within normal limits. No
mass or hydronephrosis visualized.

Abdominal aorta: No aneurysm visualized.

Other findings: None.
IMPRESSION: No acute or focal abnormality identified. Spleen measures 9.4 cm. No
focal splenic abnormality identified.

## 2016-02-02 ENCOUNTER — Ambulatory Visit: Payer: Self-pay | Admitting: Unknown Physician Specialty

## 2016-02-04 ENCOUNTER — Encounter: Payer: Self-pay | Admitting: Unknown Physician Specialty

## 2016-02-04 ENCOUNTER — Ambulatory Visit (INDEPENDENT_AMBULATORY_CARE_PROVIDER_SITE_OTHER): Payer: BLUE CROSS/BLUE SHIELD | Admitting: Unknown Physician Specialty

## 2016-02-04 VITALS — BP 120/86 | HR 106 | Temp 98.0°F | Ht 68.7 in | Wt 135.0 lb

## 2016-02-04 DIAGNOSIS — F322 Major depressive disorder, single episode, severe without psychotic features: Secondary | ICD-10-CM | POA: Diagnosis not present

## 2016-02-04 DIAGNOSIS — H9191 Unspecified hearing loss, right ear: Secondary | ICD-10-CM | POA: Diagnosis not present

## 2016-02-04 DIAGNOSIS — F329 Major depressive disorder, single episode, unspecified: Secondary | ICD-10-CM | POA: Insufficient documentation

## 2016-02-04 MED ORDER — TRETINOIN 0.05 % EX CREA: TOPICAL_CREAM | Freq: Every day | CUTANEOUS | Status: DC

## 2016-02-04 MED ORDER — AMPHETAMINE-DEXTROAMPHETAMINE 20 MG PO TABS: 20.0000 mg | ORAL_TABLET | Freq: Two times a day (BID) | ORAL | Status: DC

## 2016-02-04 MED ORDER — FLUOXETINE HCL 20 MG PO CAPS: 20.0000 mg | ORAL_CAPSULE | Freq: Every day | ORAL | Status: DC

## 2016-02-04 MED ORDER — CLONAZEPAM 1 MG PO TABS: 1.0000 mg | ORAL_TABLET | ORAL | Status: DC | PRN

## 2016-02-04 NOTE — Assessment & Plan Note (Addendum)
?   Bipolar.  Go to [employer] [facility]: Find a Social worker.  I will fill the current medications she has been getting from the psychiatrist.  I would like to make a referral to a local psychiatrist.

## 2016-02-04 NOTE — Progress Notes (Signed)
BP 120/86 mmHg  Pulse 106  Temp(Src) 98 F (36.7 C)  Ht 5' 8.7" (1.745 m)  Wt 135 lb (61.236 kg)  BMI 20.11 kg/m2  SpO2 98%  LMP 01/07/2016 (Exact Date)   Subjective:    Patient ID: Diana Daniels, female    DOB: Feb 22, 1976, 40 y.o.   MRN: [id number]  HPI: Diana Daniels is a 40 y.o. female  Chief Complaint  Patient presents with  . Depression  . Ear Fullness    pt states she has been having a problem with her right ear and would like it looked at   . 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  She went to a psychiatrist who gave her medications that don't help.  States she has taken Buproprion, Mirtazapine, Fluoxetine, Duloxetine, Brintellix, Aripiprazole (weight gain), Rexulti, and Viibryd.  She states all these medications "turned me into a zombie."  Several of them give her joint pain and inflammation.  One gave her constipation.  Duloxetine gives her diarrhea.    States she has taken Prozac for years and it helps her with her bipolar tendencies.    States the psychiatrist has given her Adderall which she feels she needs as it helps her stay awake and get things done.  She has been taken Prozac and taking Clonazepam.    She states when she is not on medications she goes into a deep dark depression or "in a rage."  At this moment she is barely functioning.  She has a small job and she works from home.    Depression screen PHQ 2/9 02/04/2016  Decreased Interest 3  Down, Depressed, Hopeless 3  PHQ - 2 Score 6  Altered sleeping 2  Tired, decreased energy 3  Change in appetite 2  Feeling bad or failure about yourself  3  Trouble concentrating 3  Moving slowly or fidgety/restless 3  Suicidal thoughts 1  PHQ-9 Score 23  Difficult doing work/chores Very difficult   6 yes answers on the MDQ   Relevant  past medical, surgical, family and social history reviewed and updated as indicated. Interim medical history since our last visit reviewed. Allergies and medications reviewed and updated.  Review of Systems  HENT:       Hearing loss right ear about 1 year ago.  States she heard a high pitch sound, it popped, and now has trouble hearing and always hears buzzing    Per HPI unless specifically indicated above     Objective:    BP 120/86 mmHg  Pulse 106  Temp(Src) 98 F (36.7 C)  Ht 5' 8.7" (1.745 m)  Wt 135 lb (61.236 kg)  BMI 20.11 kg/m2  SpO2 98%  LMP 01/07/2016 (Exact Date)  Wt Readings from Last 3 Encounters:  02/04/16 135 lb (61.236 kg)  03/07/15 138 lb 6.4 oz (62.778 kg)    Physical Exam  Constitutional: She is oriented to person, place, and time. She appears well-developed and well-nourished. No distress.  HENT:  Head: Normocephalic and atraumatic.  Eyes: Conjunctivae and lids are normal. Right eye exhibits no discharge. Left eye exhibits no discharge. No scleral icterus.  Cardiovascular: Normal rate.   Pulmonary/Chest: Effort normal.  Abdominal: Normal appearance. There is no splenomegaly or hepatomegaly.  Musculoskeletal: Normal range of motion.  Neurological: She is alert and  oriented to person, place, and time.  Skin: Skin is intact. No rash noted. No pallor.  Psychiatric: She has a normal mood and affect. Her behavior is normal. Judgment and thought content normal.    No results found for this or any previous visit.    Assessment & Plan:   Problem List Items Addressed This Visit      Unprioritized   Major depression ([place]) - Primary    ? Bipolar.  Go to [employer]: Find a Social worker.  I will fill the current medications she has been getting from the psychiatrist.  I would like to make a referral to a local psychiatrist.        Relevant Medications   FLUoxetine (PROZAC) 20 MG capsule   Other Relevant Orders   Ambulatory referral to Psychiatry        Follow up plan: Return in about 3 weeks (around 02/25/2016).

## 2016-02-04 NOTE — Patient Instructions (Signed)
Go to : [employer] [facility]: Find a therapist.

## 2016-02-06 ENCOUNTER — Other Ambulatory Visit: Payer: Self-pay | Admitting: Unknown Physician Specialty

## 2016-02-06 DIAGNOSIS — F322 Major depressive disorder, single episode, severe without psychotic features: Secondary | ICD-10-CM

## 2016-02-24 ENCOUNTER — Ambulatory Visit: Payer: BLUE CROSS/BLUE SHIELD | Admitting: Unknown Physician Specialty

## 2016-02-25 ENCOUNTER — Ambulatory Visit: Payer: BLUE CROSS/BLUE SHIELD | Admitting: Unknown Physician Specialty

## 2016-03-02 ENCOUNTER — Encounter: Payer: Self-pay | Admitting: Unknown Physician Specialty

## 2016-03-02 ENCOUNTER — Ambulatory Visit (INDEPENDENT_AMBULATORY_CARE_PROVIDER_SITE_OTHER): Payer: BLUE CROSS/BLUE SHIELD | Admitting: Unknown Physician Specialty

## 2016-03-02 ENCOUNTER — Other Ambulatory Visit: Payer: Self-pay | Admitting: Unknown Physician Specialty

## 2016-03-02 VITALS — BP 109/77 | HR 96 | Temp 98.0°F | Ht 68.5 in | Wt 137.8 lb

## 2016-03-02 DIAGNOSIS — F322 Major depressive disorder, single episode, severe without psychotic features: Secondary | ICD-10-CM

## 2016-03-02 DIAGNOSIS — K900 Celiac disease: Secondary | ICD-10-CM

## 2016-03-02 DIAGNOSIS — M549 Dorsalgia, unspecified: Secondary | ICD-10-CM

## 2016-03-02 DIAGNOSIS — G8929 Other chronic pain: Secondary | ICD-10-CM | POA: Diagnosis not present

## 2016-03-02 DIAGNOSIS — F411 Generalized anxiety disorder: Secondary | ICD-10-CM

## 2016-03-02 DIAGNOSIS — D72819 Decreased white blood cell count, unspecified: Secondary | ICD-10-CM | POA: Diagnosis not present

## 2016-03-02 DIAGNOSIS — R5382 Chronic fatigue, unspecified: Secondary | ICD-10-CM | POA: Diagnosis not present

## 2016-03-02 MED ORDER — CLONAZEPAM 1 MG PO TABS: 1.0000 mg | ORAL_TABLET | ORAL | 0 refills | Status: AC | PRN

## 2016-03-02 MED ORDER — AMPHETAMINE-DEXTROAMPHETAMINE 20 MG PO TABS: 20.0000 mg | ORAL_TABLET | Freq: Two times a day (BID) | ORAL | 0 refills | Status: AC

## 2016-03-02 MED ORDER — CYCLOBENZAPRINE HCL 10 MG PO TABS: 10.0000 mg | ORAL_TABLET | Freq: Every day | ORAL | 3 refills | Status: DC

## 2016-03-02 NOTE — Progress Notes (Signed)
BP 109/77 (BP Location: Left Arm, Patient Position: Sitting, Cuff Size: Normal)   Pulse 96   Temp 98 F (36.7 C)   Ht 5' 8.5" (1.74 m)   Wt 137 lb 12.8 oz (62.5 kg)   LMP 03/01/2016 (Exact Date)   SpO2 94%   BMI 20.65 kg/m    Subjective:    Patient ID: Diana Daniels, female    DOB: 1976-07-06, 40 y.o.   MRN: [id number]  HPI: [name] is a 40 y.o. female  Chief Complaint  Patient presents with  . Depression   Depression Pt was referred to psychiatry and planning on seeing Dr. Nicolasa Ducking for further management of her depression.  Last visit we stopped her Cymbalta and restarted Prozac and she has been "in so much pain."  Her depression seems no better.  The problem with the Cymbalta is "too much diarrhea."  Prozac is not causing diarrhea but not helping with pain.    Depression screen [facility] 2/9 03/02/2016 02/04/2016  Decreased Interest 3 3  Down, Depressed, Hopeless 2 3  PHQ - 2 Score 5 6  Altered sleeping 3 2  Tired, decreased energy 3 3  Change in appetite 3 2  Feeling bad or failure about yourself  3 3  Trouble concentrating 3 3  Moving slowly or fidgety/restless 2 3  Suicidal thoughts 1 1  PHQ-9 Score 23 23  Difficult doing work/chores - Very difficult     Pain States that "my lower back has always been a disaster"  She has pain that radiates from her neck to her lower back.  States someone at [place] diagnosed her with fibromyalgia.  She does have a history of Celiac.  She went through a battery of tests with Rheumatology at [facility] until about 2 years ago.     Relevant past medical, surgical, family and social history reviewed and updated as indicated. Interim medical history since our last visit reviewed. Allergies and medications reviewed and updated.  Review of Systems  Per HPI unless specifically indicated above     Objective:    BP 109/77 (BP Location: Left Arm, Patient Position: Sitting, Cuff Size: Normal)   Pulse 96   Temp 98 F (36.7 C)   Ht 5' 8.5"  (1.74 m)   Wt 137 lb 12.8 oz (62.5 kg)   LMP 03/01/2016 (Exact Date)   SpO2 94%   BMI 20.65 kg/m   Wt Readings from Last 3 Encounters:  03/02/16 137 lb 12.8 oz (62.5 kg)  02/04/16 135 lb (61.2 kg)  03/07/15 138 lb 6.4 oz (62.8 kg)    Physical Exam  Constitutional: She is oriented to person, place, and time. She appears well-developed and well-nourished. No distress.  HENT:  Head: Normocephalic and atraumatic.  Eyes: Conjunctivae and lids are normal. Right eye exhibits no discharge. Left eye exhibits no discharge. No scleral icterus.  Neck: Normal range of motion. Neck supple. No JVD present. Carotid bruit is not present.  Cardiovascular: Normal rate, regular rhythm and normal heart sounds.   Pulmonary/Chest: Effort normal and breath sounds normal.  Abdominal: Normal appearance. There is no splenomegaly or hepatomegaly.  Musculoskeletal: Normal range of motion.  Neurological: She is alert and oriented to person, place, and time.  Skin: Skin is warm, dry and intact. No rash noted. No pallor.  Psychiatric: She has a normal mood and affect. Her behavior is normal. Judgment and thought content normal.    No results found for this or any previous visit.  Assessment & Plan:   Problem List Items Addressed This Visit      Unprioritized   Celiac disease   Relevant Orders   Celiac Ab tTG DGP TIgA   IgA   Chronic back pain   Relevant Medications   cyclobenzaprine (FLEXERIL) 10 MG tablet   Other Relevant Orders   VITAMIN D 25 Hydroxy (Vit-D Deficiency, Fractures)   Chronic fatigue   Relevant Orders   Lyme Ab/Western Blot Reflex   Generalized anxiety disorder   Relevant Orders   TSH   B12   Leukopenia   Major depression ([place]) - Primary   Relevant Orders   CBC with Differential/Platelet   Comprehensive metabolic panel   123456    Other Visit Diagnoses   None.    Encouraged to follow up appointment with Dr. Nicolasa Ducking for management of depression.  Refill Clonazepam and Adderall  for now but needs to under the care of a specialist.  Rx for Flexeril QHS for pain.  Will fill medication only if she has an appointment with psychiatrist.    Follow up plan: Return in about 3 months (around 06/02/2016).

## 2016-03-03 ENCOUNTER — Encounter: Payer: Self-pay | Admitting: Unknown Physician Specialty

## 2016-03-03 ENCOUNTER — Telehealth: Payer: Self-pay

## 2016-03-03 ENCOUNTER — Telehealth: Payer: Self-pay | Admitting: Unknown Physician Specialty

## 2016-03-03 DIAGNOSIS — D72819 Decreased white blood cell count, unspecified: Secondary | ICD-10-CM

## 2016-03-03 MED ORDER — AMOXICILLIN 500 MG PO CAPS: 2000.0000 mg | ORAL_CAPSULE | Freq: Once | ORAL | 0 refills | Status: AC

## 2016-03-03 NOTE — Telephone Encounter (Signed)
Referred to hematology.

## 2016-03-03 NOTE — Telephone Encounter (Signed)
[facility] called to give some critical lab values on this patient. They stated that neutrophils absolute was 0.2 and WBC's were 1.3.

## 2016-03-03 NOTE — Telephone Encounter (Signed)
Called pt to refer to hematology for low WBC.  Also pre-med for dental procedures recommended from her hematologist and dentist

## 2016-03-04 LAB — COMPREHENSIVE METABOLIC PANEL
ALBUMIN: 4 g/dL (ref 3.5–5.5)
ALK PHOS: 67 IU/L (ref 39–117)
ALT: 11 IU/L (ref 0–32)
AST: 14 IU/L (ref 0–40)
Albumin/Globulin Ratio: 1.4 (ref 1.2–2.2)
BUN / CREAT RATIO: 13 (ref 9–23)
BUN: 9 mg/dL (ref 6–24)
Bilirubin Total: 0.6 mg/dL (ref 0.0–1.2)
CALCIUM: 9.5 mg/dL (ref 8.7–10.2)
CO2: 27 mmol/L (ref 18–29)
CREATININE: 0.7 mg/dL (ref 0.57–1.00)
Chloride: 100 mmol/L (ref 96–106)
GFR, EST AFRICAN AMERICAN: 124 mL/min/{1.73_m2} (ref >59)
GFR, EST NON AFRICAN AMERICAN: 108 mL/min/{1.73_m2} (ref >59)
GLOBULIN, TOTAL: 2.8 g/dL (ref 1.5–4.5)
Glucose: 78 mg/dL (ref 65–99)
Potassium: 4.4 mmol/L (ref 3.5–5.2)
SODIUM: 141 mmol/L (ref 134–144)
Total Protein: 6.8 g/dL (ref 6.0–8.5)

## 2016-03-04 LAB — CBC WITH DIFFERENTIAL/PLATELET
BASOS: 2 %
Basophils Absolute: 0 10*3/uL (ref 0.0–0.2)
EOS (ABSOLUTE): 0 10*3/uL (ref 0.0–0.4)
EOS: 2 %
HEMATOCRIT: 37.8 % (ref 34.0–46.6)
HEMOGLOBIN: 12.9 g/dL (ref 11.1–15.9)
IMMATURE GRANULOCYTES: 0 %
Immature Grans (Abs): 0 10*3/uL (ref 0.0–0.1)
Lymphocytes Absolute: 0.8 10*3/uL (ref 0.7–3.1)
Lymphs: 62 %
MCH: 32.1 pg (ref 26.6–33.0)
MCHC: 34.1 g/dL (ref 31.5–35.7)
MCV: 94 fL (ref 79–97)
MONOS ABS: 0.2 10*3/uL (ref 0.1–0.9)
Monocytes: 18 %
NEUTROS ABS: 0.2 10*3/uL — AB (ref 1.4–7.0)
NEUTROS PCT: 16 %
Platelets: 195 10*3/uL (ref 150–379)
RBC: 4.02 x10E6/uL (ref 3.77–5.28)
RDW: 13.4 % (ref 12.3–15.4)
WBC: 1.3 10*3/uL — CL (ref 3.4–10.8)

## 2016-03-04 LAB — VITAMIN B12: VITAMIN B 12: 649 pg/mL (ref 211–946)

## 2016-03-04 LAB — LYME AB/WESTERN BLOT REFLEX: LYME DISEASE AB, QUANT, IGM: <0.8 index (ref 0.00–0.79)

## 2016-03-04 LAB — CELIAC AB TTG DGP TIGA
Antigliadin Abs, IgA: 5 units (ref 0–19)
GLIADIN IGG: 3 U (ref 0–19)
IgA/Immunoglobulin A, Serum: 218 mg/dL (ref 87–352)

## 2016-03-04 LAB — VITAMIN D 25 HYDROXY (VIT D DEFICIENCY, FRACTURES): Vit D, 25-Hydroxy: 26.4 ng/mL — ABNORMAL LOW (ref 30.0–100.0)

## 2016-03-04 LAB — TSH: TSH: 3.21 u[IU]/mL (ref 0.450–4.500)

## 2016-03-24 ENCOUNTER — Inpatient Hospital Stay: Payer: BLUE CROSS/BLUE SHIELD | Attending: Oncology | Admitting: Oncology

## 2016-03-24 ENCOUNTER — Inpatient Hospital Stay: Payer: BLUE CROSS/BLUE SHIELD

## 2016-03-24 VITALS — BP 105/74 | HR 103 | Temp 97.1°F | Ht 69.0 in | Wt 137.5 lb

## 2016-03-24 DIAGNOSIS — F909 Attention-deficit hyperactivity disorder, unspecified type: Secondary | ICD-10-CM | POA: Diagnosis not present

## 2016-03-24 DIAGNOSIS — K900 Celiac disease: Secondary | ICD-10-CM | POA: Diagnosis not present

## 2016-03-24 DIAGNOSIS — F419 Anxiety disorder, unspecified: Secondary | ICD-10-CM | POA: Insufficient documentation

## 2016-03-24 DIAGNOSIS — M797 Fibromyalgia: Secondary | ICD-10-CM | POA: Diagnosis not present

## 2016-03-24 DIAGNOSIS — I7300 Raynaud's syndrome without gangrene: Secondary | ICD-10-CM | POA: Insufficient documentation

## 2016-03-24 DIAGNOSIS — F329 Major depressive disorder, single episode, unspecified: Secondary | ICD-10-CM | POA: Insufficient documentation

## 2016-03-24 DIAGNOSIS — D72819 Decreased white blood cell count, unspecified: Secondary | ICD-10-CM

## 2016-03-24 DIAGNOSIS — E162 Hypoglycemia, unspecified: Secondary | ICD-10-CM | POA: Diagnosis not present

## 2016-03-24 DIAGNOSIS — Z79899 Other long term (current) drug therapy: Secondary | ICD-10-CM | POA: Diagnosis not present

## 2016-03-24 DIAGNOSIS — D6851 Activated protein C resistance: Secondary | ICD-10-CM | POA: Insufficient documentation

## 2016-03-24 LAB — IRON AND TIBC
IRON: 111 ug/dL (ref 28–170)
SATURATION RATIOS: 35 % — AB (ref 10.4–31.8)
TIBC: 315 ug/dL (ref 250–450)
UIBC: 204 ug/dL

## 2016-03-24 LAB — CBC WITH DIFFERENTIAL/PLATELET
Basophils Absolute: 0 10*3/uL (ref 0–0.1)
Basophils Relative: 1 %
Eosinophils Absolute: 0 10*3/uL (ref 0–0.7)
Eosinophils Relative: 1 %
HCT: 38.7 % (ref 35.0–47.0)
Hemoglobin: 13.7 g/dL (ref 12.0–16.0)
Lymphocytes Relative: 30 %
Lymphs Abs: 1 10*3/uL (ref 1.0–3.6)
MCH: 32.4 pg (ref 26.0–34.0)
MCHC: 35.4 g/dL (ref 32.0–36.0)
MCV: 91.4 fL (ref 80.0–100.0)
Monocytes Absolute: 0.3 10*3/uL (ref 0.2–0.9)
Monocytes Relative: 10 %
Neutro Abs: 2 10*3/uL (ref 1.4–6.5)
Neutrophils Relative %: 58 %
Platelets: 189 10*3/uL (ref 150–440)
RBC: 4.23 MIL/uL (ref 3.80–5.20)
RDW: 12.8 % (ref 11.5–14.5)
WBC: 3.4 10*3/uL — ABNORMAL LOW (ref 3.6–11.0)

## 2016-03-24 LAB — FERRITIN: Ferritin: 33 ng/mL (ref 11–307)

## 2016-03-24 NOTE — Progress Notes (Signed)
Patient here for blood work follow up. She states her PCP had labs drawn that indicated a low platelet count.

## 2016-03-27 NOTE — Progress Notes (Signed)
[place]  Telephone:(336) [phone] [phone]) [phone]  ID: CLAUDINA OLIPHANT OB: Nov 29, 1975  MR#: [id number]  YKZ#:[id number]  Patient Care Team: Valerie Roys, DO as PCP - General (Family Medicine)  CHIEF COMPLAINT: Leukopenia.  INTERVAL HISTORY: Patient last evaluated in clinic approximately 5-6 years ago. She is referred back for further evaluation of her chronic leukopenia. She continues to feel well and remains asymptomatic. She denies any fevers or illnesses. She has good appetite and denies weight loss. She denies any pain. She has no neurologic complaints. She denies any chest pain or shortness of breath. She denies any nausea, vomiting, constipation, or diarrhea. She has no urinary complaints. Patient feels at her baseline and offers no specific complaints today.  REVIEW OF SYSTEMS:   Review of Systems  Constitutional: Negative.  Negative for chills, diaphoresis, fever, malaise/fatigue and weight loss.  Respiratory: Negative.  Negative for cough and shortness of breath.   Cardiovascular: Negative.  Negative for chest pain and leg swelling.  Gastrointestinal: Negative.  Negative for abdominal pain.  Genitourinary: Negative.  Negative for dysuria and hematuria.  Neurological: Negative.  Negative for weakness.  Psychiatric/Behavioral: Negative.     As per HPI. Otherwise, a complete review of systems is negatve.  PAST MEDICAL HISTORY: Past Medical History:  Diagnosis Date  . Anxiety   . Attention deficit hyperactivity disorder (ADHD)   . Celiac disease   . Depression   . Factor 5 Leiden mutation, heterozygous ([place])   . Fibromyalgia   . Hypoglycemia   . Raynaud's disease     PAST SURGICAL HISTORY: Past Surgical History:  Procedure Laterality Date  . FOOT SURGERY      FAMILY HISTORY: Family History  Problem Relation Age of Onset  . Clotting disorder Father   . Cancer Maternal Grandmother   . Stroke Paternal Grandmother   . Kidney disease  Paternal Grandfather        ADVANCED DIRECTIVES (Y/N):  N   HEALTH MAINTENANCE: Social History  Substance Use Topics  . Smoking status: Never Smoker  . Smokeless tobacco: Never Used  . Alcohol use No     Colonoscopy:  PAP:  Bone density:  Lipid panel:  No Known Allergies  Current Outpatient Prescriptions  Medication Sig Dispense Refill  . amphetamine-dextroamphetamine (ADDERALL) 20 MG tablet Take 1 tablet (20 mg total) by mouth 2 (two) times daily. 60 tablet 0  . calcium-vitamin D (OSCAL WITH D) 500-200 MG-UNIT tablet Take 2 tablets by mouth.    . clonazePAM (KLONOPIN) 1 MG tablet Take 1 tablet (1 mg total) by mouth as needed for anxiety. 30 tablet 0  . cyclobenzaprine (FLEXERIL) 10 MG tablet Take 1 tablet (10 mg total) by mouth at bedtime. 90 tablet 3  . FLUoxetine (PROZAC) 20 MG capsule Take 1 capsule (20 mg total) by mouth daily. 90 capsule 1  . Multiple Vitamin (MULTIVITAMIN) capsule Take 1 capsule by mouth daily.    Marland Kitchen tretinoin (RETIN-A) 0.025 % cream Apply topically at bedtime. (Patient not taking: Reported on 03/24/2016) 45 g 0   No current facility-administered medications for this visit.     OBJECTIVE: Vitals:   03/24/16 1356  BP: 105/74  Pulse: (!) 103  Temp: 97.1 F (36.2 C)     Body mass index is 20.3 kg/m.    ECOG FS:0 - Asymptomatic  General: Well-developed, well-nourished, no acute distress. Eyes: Pink conjunctiva, anicteric sclera. HEENT: Normocephalic, moist mucous membranes, clear oropharnyx. Lungs: Clear to auscultation bilaterally. Heart: Regular rate and rhythm.  No rubs, murmurs, or gallops. Abdomen: Soft, nontender, nondistended. No organomegaly noted, normoactive bowel sounds. Musculoskeletal: No edema, cyanosis, or clubbing. Neuro: Alert, answering all questions appropriately. Cranial nerves grossly intact. Skin: No rashes or petechiae noted. Psych: Normal affect. Lymphatics: No cervical, calvicular, axillary or inguinal LAD.   LAB  RESULTS:  Lab Results  Component Value Date   NA 141 03/02/2016   K 4.4 03/02/2016   CL 100 03/02/2016   CO2 27 03/02/2016   GLUCOSE 78 03/02/2016   BUN 9 03/02/2016   CREATININE 0.70 03/02/2016   CALCIUM 9.5 03/02/2016   PROT 6.8 03/02/2016   ALBUMIN 4.0 03/02/2016   AST 14 03/02/2016   ALT 11 03/02/2016   ALKPHOS 67 03/02/2016   BILITOT 0.6 03/02/2016   GFRNONAA 108 03/02/2016   GFRAA 124 03/02/2016    Lab Results  Component Value Date   WBC 3.4 (L) 03/24/2016   NEUTROABS 2.0 03/24/2016   HGB 13.7 03/24/2016   HCT 38.7 03/24/2016   MCV 91.4 03/24/2016   PLT 189 03/24/2016   Lab Results  Component Value Date   IRON 111 03/24/2016   TIBC 315 03/24/2016   IRONPCTSAT 35 (H) 03/24/2016   Lab Results  Component Value Date   FERRITIN 33 03/24/2016     STUDIES: No results found.  ASSESSMENT: Leukopenia.  PLAN:    1. Leukopenia: Chronic. Unclear etiology but patient's white blood cell count has been essentially unchanged since September 2010. She had a normal bone marrow biopsy at that time and her white blood cell count was 2.1. She had an abdominal ultrasound in June 2010 that did not reveal splenomegaly, but will repeat in the next several weeks for completeness. All of the patient's other laboratory work is either negative or within normal limits. Neutrophil antibodies and peripheral blood flow cytometry are pending at time of dictation. Return to clinic in 3 months with repeat laboratory work and further evaluation. If patient's white count remained unchanged, she likely can be discharged from clinic.  Patient expressed understanding and was in agreement with this plan. She also understands that She can call clinic at any time with any questions, concerns, or complaints.    Lloyd Huger, MD   03/27/2016 9:23 AM

## 2016-03-29 LAB — COMP PANEL: LEUKEMIA/LYMPHOMA

## 2016-03-30 ENCOUNTER — Ambulatory Visit: Payer: BLUE CROSS/BLUE SHIELD

## 2016-03-31 LAB — NEUTROPHIL AB TEST LEVEL 1: NEUTROPHIL SCR/PANEL INTERP.: POSITIVE — AB

## 2016-04-01 ENCOUNTER — Ambulatory Visit
Admission: RE | Admit: 2016-04-01 | Discharge: 2016-04-01 | Disposition: A | Discharge status: Home / Self Care | Payer: BLUE CROSS/BLUE SHIELD | Source: Ambulatory Visit | Attending: Oncology | Admitting: Oncology

## 2016-04-01 DIAGNOSIS — D72819 Decreased white blood cell count, unspecified: Secondary | ICD-10-CM

## 2016-06-15 NOTE — Progress Notes (Deleted)
[facility]  [phone]) [phone] Fax:(336) [phone]  ID: Diana Daniels OB: 1976-03-03  MR#: [id number]  BJS#:[id number]  Patient Care Team: [name], NP as PCP - General (Nurse Practitioner)  CHIEF COMPLAINT: Leukopenia.  INTERVAL HISTORY: Patient last evaluated in clinic approximately 5-6 years ago. She is referred back for further evaluation of her chronic leukopenia. She continues to feel well and remains asymptomatic. She denies any fevers or illnesses. She has good appetite and denies weight loss. She denies any pain. She has no neurologic complaints. She denies any chest pain or shortness of breath. She denies any nausea, vomiting, constipation, or diarrhea. She has no urinary complaints. Patient feels at her baseline and offers no specific complaints today.  REVIEW OF SYSTEMS:   Review of Systems  Constitutional: Negative.  Negative for chills, diaphoresis, fever, malaise/fatigue and weight loss.  Respiratory: Negative.  Negative for cough and shortness of breath.   Cardiovascular: Negative.  Negative for chest pain and leg swelling.  Gastrointestinal: Negative.  Negative for abdominal pain.  Genitourinary: Negative.  Negative for dysuria and hematuria.  Neurological: Negative.  Negative for weakness.  Psychiatric/Behavioral: Negative.     As per HPI. Otherwise, a complete review of systems is negatve.  PAST MEDICAL HISTORY: Past Medical History:  Diagnosis Date  . Anxiety   . Attention deficit hyperactivity disorder (ADHD)   . Celiac disease   . Depression   . Factor 5 Leiden mutation, heterozygous ([place])   . Fibromyalgia   . Hypoglycemia   . Raynaud's disease     PAST SURGICAL HISTORY: Past Surgical History:  Procedure Laterality Date  . FOOT SURGERY      FAMILY HISTORY: Family History  Problem Relation Age of Onset  . Clotting disorder Father   . Cancer Maternal Grandmother   . Stroke Paternal Grandmother   . Kidney disease  Paternal Grandfather        ADVANCED DIRECTIVES (Y/N):  N   HEALTH MAINTENANCE: Social History  Substance Use Topics  . Smoking status: Never Smoker  . Smokeless tobacco: Never Used  . Alcohol use No     Colonoscopy:  PAP:  Bone density:  Lipid panel:  No Known Allergies  Current Outpatient Prescriptions  Medication Sig Dispense Refill  . amphetamine-dextroamphetamine (ADDERALL) 20 MG tablet Take 1 tablet (20 mg total) by mouth 2 (two) times daily. 60 tablet 0  . calcium-vitamin D (OSCAL WITH D) 500-200 MG-UNIT tablet Take 2 tablets by mouth.    . clonazePAM (KLONOPIN) 1 MG tablet Take 1 tablet (1 mg total) by mouth as needed for anxiety. 30 tablet 0  . cyclobenzaprine (FLEXERIL) 10 MG tablet Take 1 tablet (10 mg total) by mouth at bedtime. 90 tablet 3  . FLUoxetine (PROZAC) 20 MG capsule Take 1 capsule (20 mg total) by mouth daily. 90 capsule 1  . Multiple Vitamin (MULTIVITAMIN) capsule Take 1 capsule by mouth daily.    Marland Kitchen tretinoin (RETIN-A) 0.025 % cream Apply topically at bedtime. (Patient not taking: Reported on 03/24/2016) 45 g 0   No current facility-administered medications for this visit.     OBJECTIVE: There were no vitals filed for this visit.   There is no height or weight on file to calculate BMI.    ECOG FS:0 - Asymptomatic  General: Well-developed, well-nourished, no acute distress. Eyes: Pink conjunctiva, anicteric sclera. HEENT: Normocephalic, moist mucous membranes, clear oropharnyx. Lungs: Clear to auscultation bilaterally. Heart: Regular rate and rhythm. No rubs, murmurs, or gallops. Abdomen: Soft, nontender,  nondistended. No organomegaly noted, normoactive bowel sounds. Musculoskeletal: No edema, cyanosis, or clubbing. Neuro: Alert, answering all questions appropriately. Cranial nerves grossly intact. Skin: No rashes or petechiae noted. Psych: Normal affect. Lymphatics: No cervical, calvicular, axillary or inguinal LAD.   LAB RESULTS:  Lab  Results  Component Value Date   NA 141 03/02/2016   K 4.4 03/02/2016   CL 100 03/02/2016   CO2 27 03/02/2016   GLUCOSE 78 03/02/2016   BUN 9 03/02/2016   CREATININE 0.70 03/02/2016   CALCIUM 9.5 03/02/2016   PROT 6.8 03/02/2016   ALBUMIN 4.0 03/02/2016   AST 14 03/02/2016   ALT 11 03/02/2016   ALKPHOS 67 03/02/2016   BILITOT 0.6 03/02/2016   GFRNONAA 108 03/02/2016   GFRAA 124 03/02/2016    Lab Results  Component Value Date   WBC 3.4 (L) 03/24/2016   NEUTROABS 2.0 03/24/2016   HGB 13.7 03/24/2016   HCT 38.7 03/24/2016   MCV 91.4 03/24/2016   PLT 189 03/24/2016   Lab Results  Component Value Date   IRON 111 03/24/2016   TIBC 315 03/24/2016   IRONPCTSAT 35 (H) 03/24/2016   Lab Results  Component Value Date   FERRITIN 33 03/24/2016     STUDIES: No results found.  ASSESSMENT: Leukopenia.  PLAN:    1. Leukopenia: Chronic. Unclear etiology but patient's white blood cell count has been essentially unchanged since September 2010. She had a normal bone marrow biopsy at that time and her white blood cell count was 2.1. She had an abdominal ultrasound in June 2010 that did not reveal splenomegaly, but will repeat in the next several weeks for completeness. All of the patient's other laboratory work is either negative or within normal limits. Neutrophil antibodies and peripheral blood flow cytometry are pending at time of dictation. Return to clinic in 3 months with repeat laboratory work and further evaluation. If patient's white count remained unchanged, she likely can be discharged from clinic.  Patient expressed understanding and was in agreement with this plan. She also understands that She can call clinic at any time with any questions, concerns, or complaints.    Lloyd Huger, MD   06/15/2016 9:59 PM

## 2016-06-16 ENCOUNTER — Inpatient Hospital Stay: Payer: BLUE CROSS/BLUE SHIELD

## 2016-06-16 ENCOUNTER — Inpatient Hospital Stay: Payer: BLUE CROSS/BLUE SHIELD | Admitting: Oncology

## 2016-07-12 ENCOUNTER — Inpatient Hospital Stay: Payer: BLUE CROSS/BLUE SHIELD | Admitting: Oncology

## 2016-07-12 ENCOUNTER — Inpatient Hospital Stay: Payer: BLUE CROSS/BLUE SHIELD

## 2016-07-19 NOTE — Progress Notes (Signed)
[facility]  Telephone:(336) [phone] Fax:(336) [phone]  ID: Diana Daniels OB: July 03, 1976  MR#: [id number]  SJT#:[id number]  Patient Care Team: Gabriel Cirri, NP as PCP - General (Nurse Practitioner)  CHIEF COMPLAINT: Leukopenia.  INTERVAL HISTORY: Patient returns to clinic today for repeat laboratory work and further evaluation. She continues to feel well and remains asymptomatic. She denies any fevers or illnesses. She has a good appetite and denies weight loss. She denies any pain. She has no neurologic complaints. She denies any chest pain or shortness of breath. She denies any nausea, vomiting, constipation, or diarrhea. She has no urinary complaints. Patient feels at her baseline and offers no specific complaints today.  REVIEW OF SYSTEMS:   Review of Systems  Constitutional: Negative.  Negative for chills, diaphoresis, fever, malaise/fatigue and weight loss.  Respiratory: Negative.  Negative for cough and shortness of breath.   Cardiovascular: Negative.  Negative for chest pain and leg swelling.  Gastrointestinal: Negative.  Negative for abdominal pain.  Genitourinary: Negative.  Negative for dysuria and hematuria.  Neurological: Negative.  Negative for weakness.  Psychiatric/Behavioral: Negative.     As per HPI. Otherwise, a complete review of systems is negative.  PAST MEDICAL HISTORY: Past Medical History:  Diagnosis Date  . Anxiety   . Attention deficit hyperactivity disorder (ADHD)   . Celiac disease   . Depression   . Factor 5 Leiden mutation, heterozygous ([facility])   . Fibromyalgia   . Hypoglycemia   . Raynaud's disease     PAST SURGICAL HISTORY: Past Surgical History:  Procedure Laterality Date  . FOOT SURGERY      FAMILY HISTORY: Family History  Problem Relation Age of Onset  . Clotting disorder Father   . Cancer Maternal Grandmother   . Stroke Paternal Grandmother   . Kidney disease Paternal Grandfather        ADVANCED  DIRECTIVES (Y/N):  N   HEALTH MAINTENANCE: Social History  Substance Use Topics  . Smoking status: Never Smoker  . Smokeless tobacco: Never Used  . Alcohol use No     Colonoscopy:  PAP:  Bone density:  Lipid panel:  No Known Allergies  Current Outpatient Prescriptions  Medication Sig Dispense Refill  . amphetamine-dextroamphetamine (ADDERALL) 20 MG tablet Take 1 tablet (20 mg total) by mouth 2 (two) times daily. 60 tablet 0  . calcium-vitamin D (OSCAL WITH D) 500-200 MG-UNIT tablet Take 2 tablets by mouth.    . clonazePAM (KLONOPIN) 1 MG tablet Take 1 tablet (1 mg total) by mouth as needed for anxiety. 30 tablet 0  . cyclobenzaprine (FLEXERIL) 10 MG tablet Take 1 tablet (10 mg total) by mouth at bedtime. 90 tablet 3  . FLUoxetine (PROZAC) 20 MG capsule Take 1 capsule (20 mg total) by mouth daily. 90 capsule 1  . Multiple Vitamin (MULTIVITAMIN) capsule Take 1 capsule by mouth daily.     No current facility-administered medications for this visit.     OBJECTIVE: Vitals:   07/21/16 1531  BP: 113/76  Pulse: 84  Resp: 18  Temp: 98.3 F (36.8 C)     Body mass index is 19.81 kg/m.    ECOG FS:0 - Asymptomatic  General: Well-developed, well-nourished, no acute distress. Eyes: Pink conjunctiva, anicteric sclera. Lungs: Clear to auscultation bilaterally. Heart: Regular rate and rhythm. No rubs, murmurs, or gallops. Abdomen: Soft, nontender, nondistended. No organomegaly noted, normoactive bowel sounds. Musculoskeletal: No edema, cyanosis, or clubbing. Neuro: Alert, answering all questions appropriately. Cranial nerves grossly intact. Skin: No  rashes or petechiae noted. Psych: Normal affect.   LAB RESULTS:  Lab Results  Component Value Date   NA 141 03/02/2016   K 4.4 03/02/2016   CL 100 03/02/2016   CO2 27 03/02/2016   GLUCOSE 78 03/02/2016   BUN 9 03/02/2016   CREATININE 0.70 03/02/2016   CALCIUM 9.5 03/02/2016   PROT 6.8 03/02/2016   ALBUMIN 4.0 03/02/2016    AST 14 03/02/2016   ALT 11 03/02/2016   ALKPHOS 67 03/02/2016   BILITOT 0.6 03/02/2016   GFRNONAA 108 03/02/2016   GFRAA 124 03/02/2016    Lab Results  Component Value Date   WBC 2.9 (L) 07/21/2016   NEUTROABS 1.2 (L) 07/21/2016   HGB 13.8 07/21/2016   HCT 39.4 07/21/2016   MCV 91.8 07/21/2016   PLT 187 07/21/2016   Lab Results  Component Value Date   IRON 111 03/24/2016   TIBC 315 03/24/2016   IRONPCTSAT 35 (H) 03/24/2016   Lab Results  Component Value Date   FERRITIN 33 03/24/2016     STUDIES: No results found.  ASSESSMENT: Leukopenia.  PLAN:    1. Leukopenia: Chronic. Unclear etiology but patient's white blood cell count has been essentially unchanged since September 2010. She had a normal bone marrow biopsy at that time and her white blood cell count was 2.1. She had an abdominal ultrasound in June 2010 that did not reveal splenomegaly. Repeat abdominal ultrasound from 03/2016 revealed no splenomegaly as well.  All of the patient's other laboratory work is either negative or within normal limits. Neutrophil antibodies and peripheral blood flow cytometry revealed no significant diagnostic immunophenotypic abnormality. No follow-up needed at this time. Defer to PCP for further monitoring.   Patient expressed understanding and was in agreement with this plan. She also understands that She can call clinic at any time with any questions, concerns, or complaints.   Faythe Casa, NP 07/21/2016  Patient was seen and evaluated independently and I agree with the assessment and plan as dictated above.  Lloyd Huger, MD 07/24/16 6:40 PM

## 2016-07-21 ENCOUNTER — Inpatient Hospital Stay: Payer: BLUE CROSS/BLUE SHIELD | Attending: Oncology

## 2016-07-21 ENCOUNTER — Inpatient Hospital Stay (HOSPITAL_BASED_OUTPATIENT_CLINIC_OR_DEPARTMENT_OTHER): Payer: BLUE CROSS/BLUE SHIELD | Admitting: Oncology

## 2016-07-21 VITALS — BP 113/76 | HR 84 | Temp 98.3°F | Resp 18 | Wt 134.2 lb

## 2016-07-21 DIAGNOSIS — F419 Anxiety disorder, unspecified: Secondary | ICD-10-CM | POA: Diagnosis not present

## 2016-07-21 DIAGNOSIS — Z79899 Other long term (current) drug therapy: Secondary | ICD-10-CM | POA: Insufficient documentation

## 2016-07-21 DIAGNOSIS — D6851 Activated protein C resistance: Secondary | ICD-10-CM | POA: Insufficient documentation

## 2016-07-21 DIAGNOSIS — F329 Major depressive disorder, single episode, unspecified: Secondary | ICD-10-CM | POA: Insufficient documentation

## 2016-07-21 DIAGNOSIS — K900 Celiac disease: Secondary | ICD-10-CM | POA: Diagnosis not present

## 2016-07-21 DIAGNOSIS — D72819 Decreased white blood cell count, unspecified: Secondary | ICD-10-CM

## 2016-07-21 DIAGNOSIS — M797 Fibromyalgia: Secondary | ICD-10-CM | POA: Insufficient documentation

## 2016-07-21 DIAGNOSIS — I7300 Raynaud's syndrome without gangrene: Secondary | ICD-10-CM | POA: Diagnosis not present

## 2016-07-21 LAB — CBC WITH DIFFERENTIAL/PLATELET
BASOS PCT: 1 %
Basophils Absolute: 0 10*3/uL (ref 0–0.1)
Eosinophils Absolute: 0 10*3/uL (ref 0–0.7)
Eosinophils Relative: 1 %
HEMATOCRIT: 39.4 % (ref 35.0–47.0)
HEMOGLOBIN: 13.8 g/dL (ref 12.0–16.0)
LYMPHS ABS: 1.3 10*3/uL (ref 1.0–3.6)
LYMPHS PCT: 44 %
MCH: 32.1 pg (ref 26.0–34.0)
MCHC: 35 g/dL (ref 32.0–36.0)
MCV: 91.8 fL (ref 80.0–100.0)
MONO ABS: 0.3 10*3/uL (ref 0.2–0.9)
MONOS PCT: 11 %
NEUTROS ABS: 1.2 10*3/uL — AB (ref 1.4–6.5)
NEUTROS PCT: 43 %
Platelets: 187 10*3/uL (ref 150–440)
RBC: 4.29 MIL/uL (ref 3.80–5.20)
RDW: 12.8 % (ref 11.5–14.5)
WBC: 2.9 10*3/uL — ABNORMAL LOW (ref 3.6–11.0)

## 2016-07-21 NOTE — Progress Notes (Signed)
Offers no complaints  

## 2016-11-17 ENCOUNTER — Encounter: Payer: Self-pay | Admitting: Unknown Physician Specialty

## 2016-11-17 ENCOUNTER — Telehealth: Payer: Self-pay | Admitting: Unknown Physician Specialty

## 2016-11-17 MED ORDER — RIVAROXABAN 10 MG PO TABS: 10.0000 mg | ORAL_TABLET | Freq: Every day | ORAL | 0 refills | Status: DC

## 2016-11-17 NOTE — Telephone Encounter (Signed)
Discussed with pt about her factor 5 and travel.  She will use Xaralto 10 mg during long flights.  Rx written

## 2016-11-17 NOTE — Telephone Encounter (Signed)
Routing to provider  

## 2016-11-17 NOTE — Telephone Encounter (Signed)
Pt called and stated that she is going to several different countries and would like to know if she could have enoxaparin sodium subcutaneous sent to [facility] court.

## 2016-11-18 ENCOUNTER — Other Ambulatory Visit: Payer: Self-pay | Admitting: Family Medicine

## 2016-11-18 MED ORDER — RIVAROXABAN 20 MG PO TABS: 10.0000 mg | ORAL_TABLET | Freq: Every day | ORAL | 1 refills | Status: AC

## 2017-03-04 ENCOUNTER — Ambulatory Visit (INDEPENDENT_AMBULATORY_CARE_PROVIDER_SITE_OTHER): Payer: BLUE CROSS/BLUE SHIELD | Admitting: Unknown Physician Specialty

## 2017-03-04 ENCOUNTER — Encounter: Payer: Self-pay | Admitting: Unknown Physician Specialty

## 2017-03-04 VITALS — BP 107/71 | HR 80 | Temp 98.6°F | Ht 68.1 in | Wt 134.9 lb

## 2017-03-04 DIAGNOSIS — N644 Mastodynia: Secondary | ICD-10-CM

## 2017-03-04 NOTE — Patient Instructions (Signed)
Please do call to schedule your mammogram; the number to schedule one at either [facility] or [facility] is [phone]   

## 2017-03-04 NOTE — Progress Notes (Signed)
BP 107/71   Pulse 80   Temp 98.6 F (37 C)   Ht 5' 8.1" (1.73 m)   Wt 134 lb 14.4 oz (61.2 kg)   LMP 02/16/2017 (Exact Date)   SpO2 98%   BMI 20.45 kg/m    Subjective:    Patient ID: Diana Daniels, female    DOB: 1975-11-20, 41 y.o.   MRN: [id number]  HPI: Diana Daniels is a 41 y.o. female  Chief Complaint  Patient presents with  . Breast Pain    pt states she has been having bilateral breast pain for a few weeks, states the right is worse than the left    Depression Pt states her depression is not good but got "glutened."  She is getting therapy Depression screen [facility] 2/9 03/04/2017 03/02/2016 02/04/2016  Decreased Interest 3 3 3   Down, Depressed, Hopeless 2 2 3   PHQ - 2 Score 5 5 6   Altered sleeping 2 3 2   Tired, decreased energy 3 3 3   Change in appetite 3 3 2   Feeling bad or failure about yourself  2 3 3   Trouble concentrating 3 3 3   Moving slowly or fidgety/restless 2 2 3   Suicidal thoughts 0 1 1  PHQ-9 Score 20 23 23   Difficult doing work/chores - - Very difficult   Breast pain Pt states she is having bilateral breast pain.  This started 3 weeks ago worsening with movement and no real relieving factors.  States she can "feel" her breasts.   LMP 7/18.    Relevant past medical, surgical, family and social history reviewed and updated as indicated. Interim medical history since our last visit reviewed. Allergies and medications reviewed and updated.  Review of Systems  Per HPI unless specifically indicated above     Objective:    BP 107/71   Pulse 80   Temp 98.6 F (37 C)   Ht 5' 8.1" (1.73 m)   Wt 134 lb 14.4 oz (61.2 kg)   LMP 02/16/2017 (Exact Date)   SpO2 98%   BMI 20.45 kg/m   Wt Readings from Last 3 Encounters:  03/04/17 134 lb 14.4 oz (61.2 kg)  07/21/16 134 lb 2.4 oz (60.9 kg)  03/24/16 137 lb 7.3 oz (62.4 kg)    Physical Exam  Constitutional: She is oriented to person, place, and time. She appears well-developed and well-nourished. No  distress.  HENT:  Head: Normocephalic and atraumatic.  Eyes: Conjunctivae and lids are normal. Right eye exhibits no discharge. Left eye exhibits no discharge. No scleral icterus.  Neck: Normal range of motion. Neck supple. No JVD present. Carotid bruit is not present.  Cardiovascular: Normal rate, regular rhythm and normal heart sounds.   Pulmonary/Chest: Effort normal and breath sounds normal. Right breast exhibits no inverted nipple, no mass, no nipple discharge, no skin change and no tenderness. Left breast exhibits no inverted nipple, no mass, no nipple discharge, no skin change and no tenderness. Breasts are symmetrical.  Abdominal: Normal appearance. There is no splenomegaly or hepatomegaly.  Musculoskeletal: Normal range of motion.  Neurological: She is alert and oriented to person, place, and time.  Skin: Skin is warm, dry and intact. No rash noted. No pallor.  Psychiatric: She has a normal mood and affect. Her behavior is normal. Judgment and thought content normal.     Assessment & Plan:   Problem List Items Addressed This Visit    None    Visit Diagnoses    Breast pain    -  Primary   No abnormalities noted.  Order a mammogram   Relevant Orders   MM DIGITAL SCREENING BILATERAL       Follow up plan: Return if symptoms worsen or fail to improve.

## 2017-03-08 ENCOUNTER — Encounter: Payer: BLUE CROSS/BLUE SHIELD | Admitting: Family Medicine

## 2017-03-28 ENCOUNTER — Encounter: Payer: Self-pay | Admitting: Family Medicine

## 2017-03-28 ENCOUNTER — Ambulatory Visit (INDEPENDENT_AMBULATORY_CARE_PROVIDER_SITE_OTHER): Payer: BLUE CROSS/BLUE SHIELD | Admitting: Family Medicine

## 2017-03-28 VITALS — BP 105/73 | HR 79 | Temp 98.8°F | Wt 130.0 lb

## 2017-03-28 DIAGNOSIS — H66001 Acute suppurative otitis media without spontaneous rupture of ear drum, right ear: Secondary | ICD-10-CM | POA: Diagnosis not present

## 2017-03-28 MED ORDER — AMOXICILLIN-POT CLAVULANATE 875-125 MG PO TABS: 1.0000 | ORAL_TABLET | Freq: Two times a day (BID) | ORAL | 0 refills | Status: DC

## 2017-03-28 NOTE — Progress Notes (Signed)
BP 105/73   Pulse 79   Temp 98.8 F (37.1 C)   Wt 130 lb (59 kg)   SpO2 100%   BMI 19.71 kg/m    Subjective:    Patient ID: Diana Daniels, female    DOB: [birth year], 41 y.o.   MRN: [id number]  HPI: JARITA RAVAL is a 41 y.o. female  Chief Complaint  Patient presents with  . Sinusitis    x 5 days, scratchy throat, head congestion, ears feel congested, cough, sinus drainage, low grade temp.   Patient presents with about a week of sore throat, congestion, facial pain, ear pain, cough, and low grade fevers, chills, sweats. Just flew back yesterday from several days in [place], states ears have hurt even worse since the flight (right worse than left). Now having stabbing pains and muffled hearing in right ear.Took a few doses of PCN that she'd gotten from a root canal from the dentist. Taking afrin, sudafed, and muxinex with minmal relief. No sick contacts.   Relevant past medical, surgical, family and social history reviewed and updated as indicated. Interim medical history since our last visit reviewed. Allergies and medications reviewed and updated.  Review of Systems  Constitutional: Positive for chills, diaphoresis, fatigue and fever.  HENT: Positive for congestion, ear pain, sinus pain, sinus pressure and sore throat.   Eyes: Negative.   Respiratory: Positive for cough.   Cardiovascular: Negative.   Gastrointestinal: Negative.   Genitourinary: Negative.   Musculoskeletal: Negative.   Psychiatric/Behavioral: Negative.     Per HPI unless specifically indicated above     Objective:    BP 105/73   Pulse 79   Temp 98.8 F (37.1 C)   Wt 130 lb (59 kg)   SpO2 100%   BMI 19.71 kg/m   Wt Readings from Last 3 Encounters:  03/28/17 130 lb (59 kg)  03/04/17 134 lb 14.4 oz (61.2 kg)  07/21/16 134 lb 2.4 oz (60.9 kg)    Physical Exam  Constitutional: She is oriented to person, place, and time. She appears well-developed and well-nourished. No distress.  HENT:    Nasal mucosa injected, with drainage present B/l maxillary sinuses ttp Oropharynx erythematous and mildly edematous Left TM with mild effusion Right TM erythematous and edematous   Cardiovascular: Normal rate and normal heart sounds.   Musculoskeletal: Normal range of motion.  Neurological: She is alert and oriented to person, place, and time.  Skin: Skin is warm and dry.  Psychiatric: She has a normal mood and affect. Her behavior is normal.  Nursing note and vitals reviewed.  Results for orders placed or performed in visit on 03/28/17  Rapid strep screen (not at [facility])  Result Value Ref Range   Strep Gp A Ag, IA W/Reflex Negative Negative  Culture, Group A Strep  Result Value Ref Range   Strep A Culture WILL FOLLOW       Assessment & Plan:   Problem List Items Addressed This Visit    None    Visit Diagnoses    Acute suppurative otitis media of right ear without spontaneous rupture of tympanic membrane, recurrence not specified    -  Primary   Rapid strep neg. Will treat ear infection with augmentin. Continue OTC remedies, sinus rinses. F/u if no improvement   Relevant Medications   amoxicillin-clavulanate (AUGMENTIN) 875-125 MG tablet   Other Relevant Orders   Rapid strep screen (not at [facility]) (Completed)       Follow up plan: Return if  symptoms worsen or fail to improve.

## 2017-03-29 NOTE — Patient Instructions (Signed)
Follow up if no improvement 

## 2017-03-31 LAB — CULTURE, GROUP A STREP: Strep A Culture: NEGATIVE

## 2017-03-31 LAB — RAPID STREP SCREEN (MED CTR MEBANE ONLY): STREP GP A AG, IA W/REFLEX: NEGATIVE

## 2017-07-28 ENCOUNTER — Encounter: Payer: BLUE CROSS/BLUE SHIELD | Admitting: Family Medicine

## 2017-07-28 ENCOUNTER — Encounter: Payer: Self-pay | Admitting: Family Medicine

## 2017-07-28 ENCOUNTER — Ambulatory Visit: Payer: BLUE CROSS/BLUE SHIELD | Admitting: Family Medicine

## 2017-07-28 VITALS — BP 131/88 | HR 85 | Temp 97.6°F | Ht 67.7 in | Wt 129.8 lb

## 2017-07-28 DIAGNOSIS — N898 Other specified noninflammatory disorders of vagina: Secondary | ICD-10-CM | POA: Diagnosis not present

## 2017-07-28 DIAGNOSIS — Z124 Encounter for screening for malignant neoplasm of cervix: Secondary | ICD-10-CM

## 2017-07-28 DIAGNOSIS — R29890 Loss of height: Secondary | ICD-10-CM | POA: Diagnosis not present

## 2017-07-28 DIAGNOSIS — K5909 Other constipation: Secondary | ICD-10-CM

## 2017-07-28 DIAGNOSIS — Z1239 Encounter for other screening for malignant neoplasm of breast: Secondary | ICD-10-CM

## 2017-07-28 DIAGNOSIS — Z0000 Encounter for general adult medical examination without abnormal findings: Secondary | ICD-10-CM | POA: Diagnosis not present

## 2017-07-28 DIAGNOSIS — Z1231 Encounter for screening mammogram for malignant neoplasm of breast: Secondary | ICD-10-CM

## 2017-07-28 DIAGNOSIS — L918 Other hypertrophic disorders of the skin: Secondary | ICD-10-CM

## 2017-07-28 LAB — UA/M W/RFLX CULTURE, ROUTINE
Bilirubin, UA: NEGATIVE
GLUCOSE, UA: NEGATIVE
KETONES UA: NEGATIVE
LEUKOCYTES UA: NEGATIVE
Nitrite, UA: NEGATIVE
PROTEIN UA: NEGATIVE
RBC, UA: NEGATIVE
Specific Gravity, UA: 1.005 (ref 1.005–1.030)
Urobilinogen, Ur: 0.2 mg/dL (ref 0.2–1.0)
pH, UA: 5.5 (ref 5.0–7.5)

## 2017-07-28 LAB — WET PREP FOR TRICH, YEAST, CLUE
CLUE CELL EXAM: NEGATIVE
TRICHOMONAS EXAM: NEGATIVE
YEAST EXAM: NEGATIVE

## 2017-07-28 MED ORDER — EFINACONAZOLE 10 % EX SOLN: 1.0000 "application " | Freq: Every day | CUTANEOUS | 12 refills | Status: DC

## 2017-07-28 NOTE — Progress Notes (Signed)
BP 131/88   Pulse 85   Temp 97.6 F (36.4 C) (Oral)   Ht 5' 7.7" (1.72 m)   Wt 129 lb 12.8 oz (58.9 kg)   SpO2 100%   BMI 19.91 kg/m    Subjective:    Patient ID: Diana Daniels, female    DOB: Mar 18, 1976, 41 y.o.   MRN: [id number]  HPI: Diana Daniels is a 41 y.o. female presenting on 07/28/2017 for comprehensive medical examination. Current medical complaints include:see below  Interested in HPV vaccine - never had it in the past, interested in getting them with the new recommendation to age 45.   Has skin tag at groin she would like treated. Also has one on left side of neck. States they often get irritated by her clothes.   Dealing with toenail fungus for many years- had good success on jublia several years ago but insurance stopped covering it. No OTC products seem to help.    Notes she's been dealing with excess vaginal discharge and mild itching off and on for years, wet preps are always negative. Started boric acid with some relief.   Concerned about how quickly she is losing height. Started at 5'9.5 and is now at 5'7.7, noticed her height changing over the past 2 or so years.   Has been dealing with constipation all her life, states 1 BM per week is about normal for her despite using multiple mechanisms of stool softening. Wanting to see if she needs a colonoscopy to evaluate this issue. No fhx of colon cancer.   Depression Screen done today and results listed below:  Depression screen [facility] 2/9 07/28/2017 03/04/2017 03/02/2016 02/04/2016  Decreased Interest 2 3 3 3   Down, Depressed, Hopeless 1 2 2 3   PHQ - 2 Score 3 5 5 6   Altered sleeping 2 2 3 2   Tired, decreased energy 2 3 3 3   Change in appetite 2 3 3 2   Feeling bad or failure about yourself  3 2 3 3   Trouble concentrating 1 3 3 3   Moving slowly or fidgety/restless 0 2 2 3   Suicidal thoughts 0 0 1 1  PHQ-9 Score 13 20 23 23   Difficult doing work/chores - - - Very difficult    The patient does not have a  history of falls. I did not complete a risk assessment for falls. A plan of care for falls was not documented.   Past Medical History:  Past Medical History:  Diagnosis Date  . Anxiety   . Attention deficit hyperactivity disorder (ADHD)   . Celiac disease   . Depression   . Factor 5 Leiden mutation, heterozygous ([place])   . Fibromyalgia   . Hypoglycemia   . Raynaud's disease     Surgical History:  Past Surgical History:  Procedure Laterality Date  . FOOT SURGERY      Medications:  Current Outpatient Medications on File Prior to Visit  Medication Sig  . amphetamine-dextroamphetamine (ADDERALL) 20 MG tablet Take 1 tablet (20 mg total) by mouth 2 (two) times daily.  Marland Kitchen buPROPion (WELLBUTRIN) 100 MG tablet Take 100 mg by mouth daily.  . calcium-vitamin D (OSCAL WITH D) 500-200 MG-UNIT tablet Take 2 tablets by mouth.  . clonazePAM (KLONOPIN) 1 MG tablet Take 1 tablet (1 mg total) by mouth as needed for anxiety.  Marland Kitchen FLUoxetine (PROZAC) 20 MG capsule Take 20 mg by mouth daily.  . Multiple Vitamin (MULTIVITAMIN) capsule Take 1 capsule by mouth daily.  . rivaroxaban (XARELTO)  20 MG TABS tablet Take 1 tablet (20 mg total) by mouth daily. (Patient not taking: Reported on 03/04/2017)   No current facility-administered medications on file prior to visit.     Allergies:  No Known Allergies  Social History:  Social History   Socioeconomic History  . Marital status: Single    Spouse name: Not on file  . Number of children: Not on file  . Years of education: Not on file  . Highest education level: Not on file  Social Needs  . Financial resource strain: Not on file  . Food insecurity - worry: Not on file  . Food insecurity - inability: Not on file  . Transportation needs - medical: Not on file  . Transportation needs - non-medical: Not on file  Occupational History  . Not on file  Tobacco Use  . Smoking status: Never Smoker  . Smokeless tobacco: Never Used  Substance and Sexual  Activity  . Alcohol use: No  . Drug use: No  . Sexual activity: Not Currently  Other Topics Concern  . Not on file  Social History Narrative  . Not on file   Social History   Tobacco Use  Smoking Status Never Smoker  Smokeless Tobacco Never Used   Social History   Substance and Sexual Activity  Alcohol Use No    Family History:  Family History  Problem Relation Age of Onset  . Clotting disorder Father   . Cancer Maternal Grandmother   . Stroke Paternal Grandmother   . Kidney disease Paternal Grandfather     Past medical history, surgical history, medications, allergies, family history and social history reviewed with patient today and changes made to appropriate areas of the chart.   Review of Systems - General ROS: negative Psychological ROS: negative Ophthalmic ROS: negative ENT ROS: negative Allergy and Immunology ROS: negative Breast ROS: negative for breast lumps Respiratory ROS: no cough, shortness of breath, or wheezing Cardiovascular ROS: no chest pain or dyspnea on exertion Gastrointestinal ROS: no abdominal pain, change in bowel habits, or black or bloody stools Genito-Urinary ROS: positive for - genital discharge Musculoskeletal ROS: negative Neurological ROS: no TIA or stroke symptoms Dermatological ROS: positive for nail changes and skin tags All other ROS negative except what is listed above and in the HPI.      Objective:    BP 131/88   Pulse 85   Temp 97.6 F (36.4 C) (Oral)   Ht 5' 7.7" (1.72 m)   Wt 129 lb 12.8 oz (58.9 kg)   SpO2 100%   BMI 19.91 kg/m   Wt Readings from Last 3 Encounters:  07/28/17 129 lb 12.8 oz (58.9 kg)  03/28/17 130 lb (59 kg)  03/04/17 134 lb 14.4 oz (61.2 kg)    Physical Exam  Constitutional: She is oriented to person, place, and time. She appears well-developed and well-nourished. No distress.  HENT:  Head: Atraumatic.  Right Ear: External ear normal.  Left Ear: External ear normal.  Nose: Nose normal.    Mouth/Throat: Oropharynx is clear and moist. No oropharyngeal exudate.  Eyes: Conjunctivae are normal. Pupils are equal, round, and reactive to light. No scleral icterus.  Neck: Normal range of motion. Neck supple. No thyromegaly present.  Cardiovascular: Normal rate, regular rhythm, normal heart sounds and intact distal pulses.  Pulmonary/Chest: Effort normal and breath sounds normal. No respiratory distress.  Abdominal: Soft. Bowel sounds are normal. She exhibits no mass. There is no tenderness.  Musculoskeletal: Normal range of motion.  She exhibits no edema or tenderness.  Lymphadenopathy:    She has no cervical adenopathy.  Neurological: She is alert and oriented to person, place, and time. No cranial nerve deficit.  Skin: Skin is warm and dry. No rash noted.  Thickened, cracking toenails b/l Skin tag of perineum and one on left neck  Psychiatric: She has a normal mood and affect. Her behavior is normal.  Nursing note and vitals reviewed.  Results for orders placed or performed in visit on 07/28/17  WET PREP FOR [place], YEAST, CLUE  Result Value Ref Range   Trichomonas Exam Negative Negative   Yeast Exam Negative Negative   Clue Cell Exam Negative Negative  CBC with Differential/Platelet  Result Value Ref Range   WBC 2.0 (LL) 3.4 - 10.8 x10E3/uL   RBC 4.20 3.77 - 5.28 x10E6/uL   Hemoglobin 13.5 11.1 - 15.9 g/dL   Hematocrit 39.3 34.0 - 46.6 %   MCV 94 79 - 97 fL   MCH 32.1 26.6 - 33.0 pg   MCHC 34.4 31.5 - 35.7 g/dL   RDW 12.9 12.3 - 15.4 %   Platelets 220 150 - 379 x10E3/uL   Neutrophils 15 Not Estab. %   Lymphs 55 Not Estab. %   Monocytes 26 Not Estab. %   Eos 2 Not Estab. %   Basos 1 Not Estab. %   Neutrophils Absolute 0.3 (<) 1.4 - 7.0 x10E3/uL   Lymphocytes Absolute 1.1 0.7 - 3.1 x10E3/uL   Monocytes Absolute 0.5 0.1 - 0.9 x10E3/uL   EOS (ABSOLUTE) 0.0 0.0 - 0.4 x10E3/uL   Basophils Absolute 0.0 0.0 - 0.2 x10E3/uL   Immature Granulocytes 1 Not Estab. %   Immature  Grans (Abs) 0.0 0.0 - 0.1 x10E3/uL   Hematology Comments: Note:   Comprehensive metabolic panel  Result Value Ref Range   Glucose 85 65 - 99 mg/dL   BUN 9 6 - 24 mg/dL   Creatinine, Ser 0.65 0.57 - 1.00 mg/dL   GFR calc non Af Amer 110 >59 mL/min/1.73   GFR calc Af Amer 127 >59 mL/min/1.73   BUN/Creatinine Ratio 14 9 - 23   Sodium 137 134 - 144 mmol/L   Potassium 4.3 3.5 - 5.2 mmol/L   Chloride 97 96 - 106 mmol/L   CO2 26 20 - 29 mmol/L   Calcium 9.5 8.7 - 10.2 mg/dL   Total Protein 7.1 6.0 - 8.5 g/dL   Albumin 4.5 3.5 - 5.5 g/dL   Globulin, Total 2.6 1.5 - 4.5 g/dL   Albumin/Globulin Ratio 1.7 1.2 - 2.2   Bilirubin Total 0.4 0.0 - 1.2 mg/dL   Alkaline Phosphatase 76 39 - 117 IU/L   AST 18 0 - 40 IU/L   ALT 13 0 - 32 IU/L  Lipid Panel w/o Chol/HDL Ratio  Result Value Ref Range   Cholesterol, Total 188 100 - 199 mg/dL   Triglycerides 76 0 - 149 mg/dL   HDL 52 >39 mg/dL   VLDL Cholesterol Cal 15 5 - 40 mg/dL   LDL Calculated 121 (H) 0 - 99 mg/dL  TSH  Result Value Ref Range   TSH 3.040 0.450 - 4.500 uIU/mL  UA/M w/rflx Culture, Routine  Result Value Ref Range   Specific Gravity, UA 1.005 1.005 - 1.030   pH, UA 5.5 5.0 - 7.5   Color, UA Yellow Yellow   Appearance Ur Clear Clear   Leukocytes, UA Negative Negative   Protein, UA Negative Negative/Trace   Glucose, UA Negative Negative  Ketones, UA Negative Negative   RBC, UA Negative Negative   Bilirubin, UA Negative Negative   Urobilinogen, Ur 0.2 0.2 - 1.0 mg/dL   Nitrite, UA Negative Negative  IGP, Aptima HPV, rfx 16/18,45  Result Value Ref Range   DIAGNOSIS: WILL FOLLOW    PAP NOTE: WILL FOLLOW    Note: WILL FOLLOW    HPV Aptima Negative Negative      Assessment & Plan:   Problem List Items Addressed This Visit    None    Visit Diagnoses    Height loss    -  Primary   Will get dexa scan. Recommended calcium and vitamin D supplementation and balanced diet, weight bearing exercise, good posture   Relevant  Orders   DG Bone Density   Annual physical exam       Relevant Orders   CBC with Differential/Platelet (Completed)   Comprehensive metabolic panel (Completed)   Lipid Panel w/o Chol/HDL Ratio (Completed)   TSH (Completed)   UA/M w/rflx Culture, Routine   Screening for breast cancer       Relevant Orders   MM DIGITAL SCREENING BILATERAL   Chronic constipation       Referral placed to GI for further eval. Continue stool softeners, good fiber and water intake.    Relevant Orders   Ambulatory referral to Gastroenterology   Screening for cervical cancer       Relevant Orders   IGP, Aptima HPV, rfx 16/18,45 (Completed)   Vaginal discharge       Wet prep neg. Continue boric acid, good hygiene, and start probiotics.    Relevant Orders   WET PREP FOR [place], YEAST, CLUE (Completed)   Inflamed skin tag       Both skin tags treated without complication today. Wound care reviewed. F/u if they do not completely resolve      Follow up plan: Return in about 1 year (around 07/28/2018) for CPE.   LABORATORY TESTING:  - Pap smear: pap done  IMMUNIZATIONS:   - Tdap: Tetanus vaccination status reviewed: last tetanus booster within 10 years. - Influenza: Up to date  SCREENING: -Mammogram: Ordered today   PATIENT COUNSELING:   Advised to take 1 mg of folate supplement per day if capable of pregnancy.   Sexuality: Discussed sexually transmitted diseases, partner selection, use of condoms, avoidance of unintended pregnancy  and contraceptive alternatives.   Advised to avoid cigarette smoking.  I discussed with the patient that most people either abstain from alcohol or drink within safe limits (<=14/week and <=4 drinks/occasion for males, <=7/weeks and <= 3 drinks/occasion for females) and that the risk for alcohol disorders and other health effects rises proportionally with the number of drinks per week and how often a drinker exceeds daily limits.  Discussed cessation/primary prevention of  drug use and availability of treatment for abuse.   Diet: Encouraged to adjust caloric intake to maintain  or achieve ideal body weight, to reduce intake of dietary saturated fat and total fat, to limit sodium intake by avoiding high sodium foods and not adding table salt, and to maintain adequate dietary potassium and calcium preferably from fresh fruits, vegetables, and low-fat dairy products.    stressed the importance of regular exercise  Injury prevention: Discussed safety belts, safety helmets, smoke detector, smoking near bedding or upholstery.   Dental health: Discussed importance of regular tooth brushing, flossing, and dental visits.    NEXT PREVENTATIVE PHYSICAL DUE IN 1 YEAR. Return in about 1 year (  around 07/28/2018) for CPE.

## 2017-07-28 NOTE — Patient Instructions (Signed)
[facility] - [phone]    

## 2017-07-29 ENCOUNTER — Telehealth: Payer: Self-pay | Admitting: Unknown Physician Specialty

## 2017-07-29 LAB — CBC WITH DIFFERENTIAL/PLATELET
BASOS: 1 %
Basophils Absolute: 0 10*3/uL (ref 0.0–0.2)
EOS (ABSOLUTE): 0 10*3/uL (ref 0.0–0.4)
EOS: 2 %
HEMATOCRIT: 39.3 % (ref 34.0–46.6)
HEMOGLOBIN: 13.5 g/dL (ref 11.1–15.9)
IMMATURE GRANULOCYTES: 1 %
Immature Grans (Abs): 0 10*3/uL (ref 0.0–0.1)
LYMPHS ABS: 1.1 10*3/uL (ref 0.7–3.1)
Lymphs: 55 %
MCH: 32.1 pg (ref 26.6–33.0)
MCHC: 34.4 g/dL (ref 31.5–35.7)
MCV: 94 fL (ref 79–97)
MONOS ABS: 0.5 10*3/uL (ref 0.1–0.9)
Monocytes: 26 %
Neutrophils Absolute: 0.3 10*3/uL — CL (ref 1.4–7.0)
Neutrophils: 15 %
Platelets: 220 10*3/uL (ref 150–379)
RBC: 4.2 x10E6/uL (ref 3.77–5.28)
RDW: 12.9 % (ref 12.3–15.4)
WBC: 2 10*3/uL — CL (ref 3.4–10.8)

## 2017-07-29 LAB — LIPID PANEL W/O CHOL/HDL RATIO
CHOLESTEROL TOTAL: 188 mg/dL (ref 100–199)
HDL: 52 mg/dL (ref >39)
LDL CALC: 121 mg/dL — AB (ref 0–99)
Triglycerides: 76 mg/dL (ref 0–149)
VLDL Cholesterol Cal: 15 mg/dL (ref 5–40)

## 2017-07-29 LAB — COMPREHENSIVE METABOLIC PANEL
ALBUMIN: 4.5 g/dL (ref 3.5–5.5)
ALK PHOS: 76 IU/L (ref 39–117)
ALT: 13 IU/L (ref 0–32)
AST: 18 IU/L (ref 0–40)
Albumin/Globulin Ratio: 1.7 (ref 1.2–2.2)
BUN / CREAT RATIO: 14 (ref 9–23)
BUN: 9 mg/dL (ref 6–24)
Bilirubin Total: 0.4 mg/dL (ref 0.0–1.2)
CO2: 26 mmol/L (ref 20–29)
CREATININE: 0.65 mg/dL (ref 0.57–1.00)
Calcium: 9.5 mg/dL (ref 8.7–10.2)
Chloride: 97 mmol/L (ref 96–106)
GFR, EST AFRICAN AMERICAN: 127 mL/min/{1.73_m2} (ref >59)
GFR, EST NON AFRICAN AMERICAN: 110 mL/min/{1.73_m2} (ref >59)
GLOBULIN, TOTAL: 2.6 g/dL (ref 1.5–4.5)
Glucose: 85 mg/dL (ref 65–99)
Potassium: 4.3 mmol/L (ref 3.5–5.2)
SODIUM: 137 mmol/L (ref 134–144)
Total Protein: 7.1 g/dL (ref 6.0–8.5)

## 2017-07-29 LAB — TSH: TSH: 3.04 u[IU]/mL (ref 0.450–4.500)

## 2017-07-29 NOTE — Telephone Encounter (Signed)
PA submitted and pending review 

## 2017-07-29 NOTE — Telephone Encounter (Signed)
Copied from [place] [id number]. Topic: Quick Communication - See Telephone Encounter >> Jul 29, 2017 11:34 AM Ahmed Prima L wrote: CRM for notification. See Telephone encounter for:   07/29/17.  [employer] called and needs a pre autho on jublia 10%. Call back is [phone]. Fax [phone]

## 2017-08-01 ENCOUNTER — Telehealth: Payer: Self-pay | Admitting: Unknown Physician Specialty

## 2017-08-01 ENCOUNTER — Encounter: Payer: BLUE CROSS/BLUE SHIELD | Admitting: Family Medicine

## 2017-08-01 NOTE — Telephone Encounter (Signed)
Copied from [place] [id number]. Topic: Quick Communication - See Telephone Encounter >> Aug 01, 2017  2:31 PM Corie Chiquito, [place] wrote: CRM for notification. See Telephone encounter for: Wyatt Portela called from [facility] because they need medical records documention for this patient. If someone from the office could give her a call back at   08/01/17.

## 2017-08-01 NOTE — Telephone Encounter (Signed)
Called and spoke to [place] with [facility]. She stated that they need documentation for the PA we submitted for the efinaconazole prescription. She states that they need documentation about the fungal culture and the patient being immunocompromised. Information to be faxed to [phone] with reference number [id number]. Most recent OV note not cosigned yet, will print the note as soon as it is and fax.

## 2017-08-03 NOTE — Telephone Encounter (Signed)
OV note printed and faxed to [facility].

## 2017-08-04 LAB — IGP, APTIMA HPV, RFX 16/18,45
HPV Aptima: NEGATIVE
PAP Smear Comment: 0

## 2017-08-15 ENCOUNTER — Ambulatory Visit: Payer: BLUE CROSS/BLUE SHIELD | Admitting: Gastroenterology

## 2017-08-24 ENCOUNTER — Ambulatory Visit: Payer: BLUE CROSS/BLUE SHIELD | Admitting: Gastroenterology

## 2017-08-29 ENCOUNTER — Ambulatory Visit: Payer: BLUE CROSS/BLUE SHIELD | Admitting: Family Medicine

## 2017-08-29 ENCOUNTER — Encounter: Payer: Self-pay | Admitting: Family Medicine

## 2017-08-29 VITALS — BP 90/56 | HR 92 | Temp 97.7°F | Wt 129.4 lb

## 2017-08-29 DIAGNOSIS — B351 Tinea unguium: Secondary | ICD-10-CM

## 2017-08-29 DIAGNOSIS — J069 Acute upper respiratory infection, unspecified: Secondary | ICD-10-CM | POA: Diagnosis not present

## 2017-08-29 MED ORDER — HYDROCOD POLST-CPM POLST ER 10-8 MG/5ML PO SUER: 5.0000 mL | Freq: Two times a day (BID) | ORAL | 0 refills | Status: DC | PRN

## 2017-08-29 MED ORDER — AMOXICILLIN-POT CLAVULANATE 875-125 MG PO TABS: 1.0000 | ORAL_TABLET | Freq: Two times a day (BID) | ORAL | 0 refills | Status: DC

## 2017-08-29 NOTE — Patient Instructions (Signed)
Follow up as needed

## 2017-08-29 NOTE — Progress Notes (Signed)
BP (!) 90/56 (BP Location: Right Arm, Patient Position: Sitting, Cuff Size: Normal)   Pulse 92   Temp 97.7 F (36.5 C) (Oral)   Wt 129 lb 6.4 oz (58.7 kg)   SpO2 99%   BMI 19.85 kg/m    Subjective:    Patient ID: Diana Daniels, female    DOB: 14-May-1976, 42 y.o.   MRN: [id number]  HPI: Diana Daniels is a 42 y.o. female  Chief Complaint  Patient presents with  . Sinusitis    x's 1 month. Tried Flonase, Sudafed, Nyquil and Dayquil.  . Cough    Productive  . Nasal Congestion    Constantly drips but patient states when she blows her nose, nothing comes out  . Ear Pain    Bilateral  . Sore Throat   About a month of sinus pain and pressure, congestion, b/l ear pain and pressure, sore throat, chills, and sweats. Seemed to get some better with flonase, sudafed, dayquil, and nyquil for a few weeks but the last 5 days has gotten much worse and now having worsening facial pain and pressure, HA, productive cough, very sore throat. Lots of sick contacts lately.  Relevant past medical, surgical, family and social history reviewed and updated as indicated. Interim medical history since our last visit reviewed. Allergies and medications reviewed and updated.  Review of Systems  Constitutional: Positive for chills and diaphoresis.  HENT: Positive for congestion, ear pain, sinus pressure, sinus pain and sore throat.   Respiratory: Positive for cough.   Cardiovascular: Negative.   Gastrointestinal: Negative.   Genitourinary: Negative.   Musculoskeletal: Negative.   Skin: Negative.   Neurological: Positive for headaches.  Psychiatric/Behavioral: Negative.    Per HPI unless specifically indicated above     Objective:    BP (!) 90/56 (BP Location: Right Arm, Patient Position: Sitting, Cuff Size: Normal)   Pulse 92   Temp 97.7 F (36.5 C) (Oral)   Wt 129 lb 6.4 oz (58.7 kg)   SpO2 99%   BMI 19.85 kg/m   Wt Readings from Last 3 Encounters:  08/29/17 129 lb 6.4 oz (58.7 kg)    07/28/17 129 lb 12.8 oz (58.9 kg)  03/28/17 130 lb (59 kg)    Physical Exam  Constitutional: She is oriented to person, place, and time. She appears well-developed and well-nourished. No distress.  HENT:  Head: Atraumatic.  Right Ear: External ear normal.  Left Ear: External ear normal.  Oropharynx and nasal mucosa erythematous with thick drainage present  Eyes: Conjunctivae are normal. Pupils are equal, round, and reactive to light.  Neck: Normal range of motion. Neck supple.  Cardiovascular: Normal rate and normal heart sounds.  Pulmonary/Chest: Effort normal and breath sounds normal. No respiratory distress.  Musculoskeletal: Normal range of motion.  Neurological: She is alert and oriented to person, place, and time.  Skin: Skin is warm and dry.  Psychiatric: She has a normal mood and affect. Her behavior is normal.  Nursing note and vitals reviewed.     Assessment & Plan:   Problem List Items Addressed This Visit    None    Visit Diagnoses    Upper respiratory tract infection, unspecified type    -  Primary   Will treat with augmentin, mucinex, flonase, and tussionex. Precautions on sedation reviewed. Follow up if worsening or no improvement   Onychomycosis       Will refer to Dermatology for nail bx to get insurance authorization for Jublia. Continue OTC  products and home care in meantime   Relevant Orders   Ambulatory referral to Dermatology      Follow up plan: Return if symptoms worsen or fail to improve.

## 2017-10-10 ENCOUNTER — Ambulatory Visit: Payer: Self-pay | Admitting: Family Medicine

## 2017-10-10 ENCOUNTER — Ambulatory Visit: Payer: BLUE CROSS/BLUE SHIELD | Admitting: Unknown Physician Specialty

## 2017-10-11 ENCOUNTER — Ambulatory Visit
Admission: RE | Admit: 2017-10-11 | Discharge: 2017-10-11 | Disposition: A | Discharge status: Home / Self Care | Payer: BLUE CROSS/BLUE SHIELD | Source: Ambulatory Visit | Attending: Family Medicine | Admitting: Family Medicine

## 2017-10-11 DIAGNOSIS — R29890 Loss of height: Secondary | ICD-10-CM | POA: Insufficient documentation

## 2017-10-11 DIAGNOSIS — Z1231 Encounter for screening mammogram for malignant neoplasm of breast: Secondary | ICD-10-CM | POA: Diagnosis not present

## 2017-10-11 DIAGNOSIS — Z1239 Encounter for other screening for malignant neoplasm of breast: Secondary | ICD-10-CM

## 2017-10-18 ENCOUNTER — Ambulatory Visit: Payer: BLUE CROSS/BLUE SHIELD | Admitting: Unknown Physician Specialty

## 2017-10-18 ENCOUNTER — Encounter: Payer: Self-pay | Admitting: Unknown Physician Specialty

## 2017-10-18 VITALS — BP 106/76 | HR 101 | Temp 97.8°F | Ht 67.0 in | Wt 128.8 lb

## 2017-10-18 DIAGNOSIS — F322 Major depressive disorder, single episode, severe without psychotic features: Secondary | ICD-10-CM

## 2017-10-18 DIAGNOSIS — M359 Systemic involvement of connective tissue, unspecified: Secondary | ICD-10-CM | POA: Diagnosis not present

## 2017-10-18 DIAGNOSIS — M797 Fibromyalgia: Secondary | ICD-10-CM

## 2017-10-18 DIAGNOSIS — R29890 Loss of height: Secondary | ICD-10-CM | POA: Insufficient documentation

## 2017-10-18 DIAGNOSIS — F411 Generalized anxiety disorder: Secondary | ICD-10-CM | POA: Diagnosis not present

## 2017-10-18 DIAGNOSIS — Z23 Encounter for immunization: Secondary | ICD-10-CM | POA: Diagnosis not present

## 2017-10-18 NOTE — Assessment & Plan Note (Signed)
Per psychiatry 

## 2017-10-18 NOTE — Assessment & Plan Note (Addendum)
Normal bone density.  Consider referral to Endocrine.

## 2017-10-18 NOTE — Patient Instructions (Signed)
Td Vaccine (Tetanus and Diphtheria): What You Need to Know 1. Why get vaccinated? Tetanus  and diphtheria are very serious diseases. They are rare in the [place] today, but people who do become infected often have severe complications. Td vaccine is used to protect adolescents and adults from both of these diseases. Both tetanus and diphtheria are infections caused by bacteria. Diphtheria spreads from person to person through coughing or sneezing. Tetanus-causing bacteria enter the body through cuts, scratches, or wounds. TETANUS (lockjaw) causes painful muscle tightening and stiffness, usually all over the body.  It can lead to tightening of muscles in the head and neck so you can't open your mouth, swallow, or sometimes even breathe. Tetanus kills about 1 out of every 10 people who are infected even after receiving the best medical care.  DIPHTHERIA can cause a thick coating to form in the back of the throat.  It can lead to breathing problems, paralysis, heart failure, and death.  Before vaccines, as many as 200,000 cases of diphtheria and hundreds of cases of tetanus were reported in the [place] each year. Since vaccination began, reports of cases for both diseases have dropped by about 99%. 2. Td vaccine Td vaccine can protect adolescents and adults from tetanus and diphtheria. Td is usually given as a booster dose every 10 years but it can also be given earlier after a severe and dirty wound or burn. Another vaccine, called Tdap, which protects against pertussis in addition to tetanus and diphtheria, is sometimes recommended instead of Td vaccine. Your doctor or the person giving you the vaccine can give you more information. Td may safely be given at the same time as other vaccines. 3. Some people should not get this vaccine  A person who has ever had a life-threatening allergic reaction after a previous dose of any tetanus or diphtheria containing vaccine, OR has a severe  allergy to any part of this vaccine, should not get Td vaccine. Tell the person giving the vaccine about any severe allergies.  Talk to your doctor if you: ? had severe pain or swelling after any vaccine containing diphtheria or tetanus, ? ever had a condition called Guillain Barre Syndrome (GBS), ? aren't feeling well on the day the shot is scheduled. 4. What are the risks from Td vaccine? With any medicine, including vaccines, there is a chance of side effects. These are usually mild and go away on their own. Serious reactions are also possible but are rare. Most people who get Td vaccine do not have any problems with it. Mild problems following Td vaccine: (Did not interfere with activities)  Pain where the shot was given (about 8 people in 10)  Redness or swelling where the shot was given (about 1 person in 4)  Mild fever (rare)  Headache (about 1 person in 4)  Tiredness (about 1 person in 4)  Moderate problems following Td vaccine: (Interfered with activities, but did not require medical attention)  Fever over 102F (rare)  Severe problems following Td vaccine: (Unable to perform usual activities; required medical attention)  Swelling, severe pain, bleeding and/or redness in the arm where the shot was given (rare).  Problems that could happen after any vaccine:  People sometimes faint after a medical procedure, including vaccination. Sitting or lying down for about 15 minutes can help prevent fainting, and injuries caused by a fall. Tell your doctor if you feel dizzy, or have vision changes or ringing in the ears.  Some people get   severe pain in the shoulder and have difficulty moving the arm where a shot was given. This happens very rarely.  Any medication can cause a severe allergic reaction. Such reactions from a vaccine are very rare, estimated at fewer than 1 in a million doses, and would happen within a few minutes to a few hours after the vaccination. As with any  medicine, there is a very remote chance of a vaccine causing a serious injury or death. The safety of vaccines is always being monitored. For more information, visit: www.cdc.gov/vaccinesafety/ 5. What if there is a serious reaction? What should I look for? Look for anything that concerns you, such as signs of a severe allergic reaction, very high fever, or unusual behavior. Signs of a severe allergic reaction can include hives, swelling of the face and throat, difficulty breathing, a fast heartbeat, dizziness, and weakness. These would usually start a few minutes to a few hours after the vaccination. What should I do?  If you think it is a severe allergic reaction or other emergency that can't wait, call 9-1-1 or get the person to the nearest hospital. Otherwise, call your doctor.  Afterward, the reaction should be reported to the Vaccine Adverse Event Reporting System (VAERS). Your doctor might file this report, or you can do it yourself through the VAERS web site at www.vaers.hhs.gov, or by calling 1-800-822-7967. ? VAERS does not give medical advice. 6. The National Vaccine Injury Compensation Program The National Vaccine Injury Compensation Program (VICP) is a federal program that was created to compensate people who may have been injured by certain vaccines. Persons who believe they may have been injured by a vaccine can learn about the program and about filing a claim by calling 1-800-338-2382 or visiting the VICP website at www.hrsa.gov/vaccinecompensation. There is a time limit to file a claim for compensation. 7. How can I learn more?  Ask your doctor. He or she can give you the vaccine package insert or suggest other sources of information.  Call your local or state health department.  Contact the Centers for Disease Control and Prevention (CDC): ? Call 1-800-232-4636 (1-800-CDC-INFO) ? Visit CDC's website at www.cdc.gov/vaccines CDC Td Vaccine VIS (11/11/15) This information is  not intended to replace advice given to you by your health care provider. Make sure you discuss any questions you have with your health care provider. Document Released: 05/16/2006 Document Revised: 04/08/2016 Document Reviewed: 04/08/2016 Elsevier Interactive Patient Education  2017 Elsevier Inc.  

## 2017-10-18 NOTE — Assessment & Plan Note (Signed)
Followed by psychiatry 

## 2017-10-18 NOTE — Assessment & Plan Note (Signed)
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

## 2017-10-18 NOTE — Progress Notes (Signed)
BP 106/76   Pulse (!) 101   Temp 97.8 F (36.6 C) (Oral)   Ht 5' 7" (1.702 m)   Wt 128 lb 12.8 oz (58.4 kg)   SpO2 98%   BMI 20.17 kg/m    Subjective:    Patient ID: LAKIESHA RALPHS, female    DOB: [birth year], 42 y.o.   MRN: [id number]  HPI: Diana Daniels is a 42 y.o. female  Chief Complaint  Patient presents with  . Pain    pt states she has been having severe and constant pain with her fibromyalgia and depression. States she is going to apply for disability.    Pt states she would like to apply for disability.  She is having increased weakness, pain, fatigue, and cannot walk for extended distances.  She has "serious issues" with her hands.  She has debilitating anxiety and depression.  She cannot leave her house much of the time.  She feels disability would be a way to support herself and relieve burdens shouldered by her parents.  She does see a psychiatrist and psychologist.    She was diagnosed with Fibromyalgia 05/19/2012 at [facility] primary care.    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  She has seen rheumatology with last visit 06/06/2013.  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  She did show my a picture which is similar to the butterfly rash of lupus.   She is concerned about a loss of height about 2 inches 5'9 1/2 to 5'7.7".  .  Bone density reviewed and was normal.     Celiac diagnosed 01/21/2012  Relevant past medical, surgical, family and social history reviewed and updated as indicated. Interim medical history since our last visit reviewed. Allergies and medications reviewed and updated.  Review of Systems  Per HPI unless specifically indicated above     Objective:    BP 106/76   Pulse (!) 101   Temp 97.8 F  (36.6 C) (Oral)   Ht 5' 7" (1.702 m)   Wt 128 lb 12.8 oz (58.4 kg)   SpO2 98%   BMI 20.17 kg/m   Wt Readings from Last 3 Encounters:  10/18/17 128 lb 12.8 oz (58.4 kg)  08/29/17 129 lb 6.4 oz (58.7 kg)  07/28/17 129 lb 12.8 oz (58.9 kg)    Physical Exam  Constitutional: She is oriented to person, place, and time. She appears well-developed and well-nourished. No distress.  HENT:  Head: Normocephalic and atraumatic.  Eyes: Conjunctivae and lids are normal. Right eye exhibits no discharge. Left eye exhibits no discharge. No scleral icterus.  Neck: Normal range of motion. Neck supple. No JVD present. Carotid bruit is not present.  Cardiovascular: Normal rate, regular rhythm and normal heart sounds.  Pulmonary/Chest: Effort normal and breath sounds normal.  Abdominal: Normal appearance. There is no splenomegaly or hepatomegaly.  Musculoskeletal: Normal range of motion.  Neurological: She is alert and oriented to person, place, and time.  Skin: Skin is warm, dry and intact. No rash noted. No pallor.  Psychiatric: She has a normal mood and affect. Her behavior is normal. Judgment and thought content normal.    Results for orders placed or performed in visit on 07/28/17  WET PREP FOR [place], YEAST, CLUE  Result Value Ref Range   Trichomonas Exam  Negative Negative   Yeast Exam Negative Negative   Clue Cell Exam Negative Negative  CBC with Differential/Platelet  Result Value Ref Range   WBC 2.0 (LL) 3.4 - 10.8 x10E3/uL   RBC 4.20 3.77 - 5.28 x10E6/uL   Hemoglobin 13.5 11.1 - 15.9 g/dL   Hematocrit 39.3 34.0 - 46.6 %   MCV 94 79 - 97 fL   MCH 32.1 26.6 - 33.0 pg   MCHC 34.4 31.5 - 35.7 g/dL   RDW 12.9 12.3 - 15.4 %   Platelets 220 150 - 379 x10E3/uL   Neutrophils 15 Not Estab. %   Lymphs 55 Not Estab. %   Monocytes 26 Not Estab. %   Eos 2 Not Estab. %   Basos 1 Not Estab. %   Neutrophils Absolute 0.3 (<) 1.4 - 7.0 x10E3/uL   Lymphocytes Absolute 1.1 0.7 - 3.1 x10E3/uL    Monocytes Absolute 0.5 0.1 - 0.9 x10E3/uL   EOS (ABSOLUTE) 0.0 0.0 - 0.4 x10E3/uL   Basophils Absolute 0.0 0.0 - 0.2 x10E3/uL   Immature Granulocytes 1 Not Estab. %   Immature Grans (Abs) 0.0 0.0 - 0.1 x10E3/uL   Hematology Comments: Note:   Comprehensive metabolic panel  Result Value Ref Range   Glucose 85 65 - 99 mg/dL   BUN 9 6 - 24 mg/dL   Creatinine, Ser 0.65 0.57 - 1.00 mg/dL   GFR calc non Af Amer 110 >59 mL/min/1.73   GFR calc Af Amer 127 >59 mL/min/1.73   BUN/Creatinine Ratio 14 9 - 23   Sodium 137 134 - 144 mmol/L   Potassium 4.3 3.5 - 5.2 mmol/L   Chloride 97 96 - 106 mmol/L   CO2 26 20 - 29 mmol/L   Calcium 9.5 8.7 - 10.2 mg/dL   Total Protein 7.1 6.0 - 8.5 g/dL   Albumin 4.5 3.5 - 5.5 g/dL   Globulin, Total 2.6 1.5 - 4.5 g/dL   Albumin/Globulin Ratio 1.7 1.2 - 2.2   Bilirubin Total 0.4 0.0 - 1.2 mg/dL   Alkaline Phosphatase 76 39 - 117 IU/L   AST 18 0 - 40 IU/L   ALT 13 0 - 32 IU/L  Lipid Panel w/o Chol/HDL Ratio  Result Value Ref Range   Cholesterol, Total 188 100 - 199 mg/dL   Triglycerides 76 0 - 149 mg/dL   HDL 52 >39 mg/dL   VLDL Cholesterol Cal 15 5 - 40 mg/dL   LDL Calculated 121 (H) 0 - 99 mg/dL  TSH  Result Value Ref Range   TSH 3.040 0.450 - 4.500 uIU/mL  UA/M w/rflx Culture, Routine  Result Value Ref Range   Specific Gravity, UA 1.005 1.005 - 1.030   pH, UA 5.5 5.0 - 7.5   Color, UA Yellow Yellow   Appearance Ur Clear Clear   Leukocytes, UA Negative Negative   Protein, UA Negative Negative/Trace   Glucose, UA Negative Negative   Ketones, UA Negative Negative   RBC, UA Negative Negative   Bilirubin, UA Negative Negative   Urobilinogen, Ur 0.2 0.2 - 1.0 mg/dL   Nitrite, UA Negative Negative  IGP, Aptima HPV, rfx 16/18,45  Result Value Ref Range   DIAGNOSIS: Comment (A)    Specimen adequacy: Comment    Clinician Provided ICD10 Comment    Performed by: Comment    Electronically signed by: Comment    PAP Smear Comment .    PATHOLOGIST  PROVIDED ICD10: Comment    Note: Comment    Test Methodology Comment  HPV Aptima Negative Negative      Assessment & Plan:   Problem List Items Addressed This Visit      Unprioritized   Connective tissue disease ([place])    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 Will recheck lupus labs, chronic inflammation and lyme titers      Relevant Orders   Ambulatory referral to Rheumatology   Generalized anxiety disorder    Per psychiatry      Loss of height    Normal bone density.  Consider referral to Endocrine.        Major depression    Followed by psychiatry       Other Visit Diagnoses    Need for Td vaccine    -  Primary   Relevant Orders   Td vaccine greater than or equal to 7yo preservative free IM (Completed)       Follow up plan: F/U of results of labs

## 2017-10-18 NOTE — Assessment & Plan Note (Signed)
Labs for lupus are out of date. Will recheck lupus labs, chronic inflammation and lyme titers

## 2017-10-19 ENCOUNTER — Telehealth: Payer: Self-pay | Admitting: Unknown Physician Specialty

## 2017-10-19 ENCOUNTER — Other Ambulatory Visit: Payer: Self-pay | Admitting: Unknown Physician Specialty

## 2017-10-19 DIAGNOSIS — M359 Systemic involvement of connective tissue, unspecified: Secondary | ICD-10-CM

## 2017-10-19 DIAGNOSIS — R768 Other specified abnormal immunological findings in serum: Secondary | ICD-10-CM

## 2017-10-19 NOTE — Telephone Encounter (Addendum)
Diana Daniels, Customer Service at [employer] called to report the following values: Neutrophils absolute 0.3 and WBC 1.8; Labs drawn 10/18/17 at 1411; lab values re[eated for verification; will route to office for notification; Antony Haste states that this is apt of [name].

## 2017-10-19 NOTE — Telephone Encounter (Signed)
Lionel December from [employer] called with patients results..  Neurophil 0.3 White Blood Count 1.8    Thanks

## 2017-10-19 NOTE — Telephone Encounter (Signed)
Diana Daniels at [facility] given critical values; she verbalizes understanding and will notify physician of findings.l

## 2017-10-21 LAB — ENA+DNA/DS+SJORGEN'S: ENA SSB (LA) Ab: <0.2 AI (ref 0.0–0.9)

## 2017-10-21 LAB — CBC WITH DIFFERENTIAL/PLATELET
Basophils Absolute: 0 10*3/uL (ref 0.0–0.2)
Basos: 1 %
EOS (ABSOLUTE): 0 10*3/uL (ref 0.0–0.4)
EOS: 2 %
Hematocrit: 43.2 % (ref 34.0–46.6)
Hemoglobin: 14.8 g/dL (ref 11.1–15.9)
IMMATURE GRANS (ABS): 0 10*3/uL (ref 0.0–0.1)
IMMATURE GRANULOCYTES: 0 %
LYMPHS: 60 %
Lymphocytes Absolute: 1.1 10*3/uL (ref 0.7–3.1)
MCH: 32.3 pg (ref 26.6–33.0)
MCHC: 34.3 g/dL (ref 31.5–35.7)
MCV: 94 fL (ref 79–97)
MONOS ABS: 0.4 10*3/uL (ref 0.1–0.9)
Monocytes: 20 %
NEUTROS PCT: 17 %
Neutrophils Absolute: 0.3 10*3/uL — CL (ref 1.4–7.0)
Platelets: 233 10*3/uL (ref 150–379)
RBC: 4.58 x10E6/uL (ref 3.77–5.28)
RDW: 13.1 % (ref 12.3–15.4)
WBC: 1.8 10*3/uL — AB (ref 3.4–10.8)

## 2017-10-21 LAB — LYME AB/WESTERN BLOT REFLEX: LYME DISEASE AB, QUANT, IGM: <0.8 index (ref 0.00–0.79)

## 2017-10-21 LAB — C-REACTIVE PROTEIN: CRP: 0.8 mg/L (ref 0.0–4.9)

## 2017-10-21 LAB — ANA W/REFLEX: ANA: POSITIVE — AB

## 2017-10-21 LAB — SEDIMENTATION RATE: Sed Rate: 2 mm/hr (ref 0–32)

## 2017-10-21 LAB — TISSUE TRANSGLUTAMINASE ABS,IGG,IGA
Tissue Transglut Ab: <2 U/mL (ref 0–5)
Transglutaminase IgA: <2 U/mL (ref 0–3)

## 2017-10-21 LAB — ALPHA GAL IGE: Alpha Gal IgE*: <0.1 kU/L (ref <0.10)

## 2017-10-21 LAB — RHEUMATOID FACTOR

## 2017-10-25 ENCOUNTER — Telehealth: Payer: Self-pay

## 2017-10-26 ENCOUNTER — Ambulatory Visit: Payer: BLUE CROSS/BLUE SHIELD | Admitting: Gastroenterology

## 2017-10-26 ENCOUNTER — Encounter: Payer: Self-pay | Admitting: Gastroenterology

## 2017-10-26 VITALS — BP 117/70 | HR 112 | Ht 67.0 in | Wt 129.2 lb

## 2017-10-26 DIAGNOSIS — R1310 Dysphagia, unspecified: Secondary | ICD-10-CM

## 2017-10-26 DIAGNOSIS — K5900 Constipation, unspecified: Secondary | ICD-10-CM

## 2017-10-26 MED ORDER — OMEPRAZOLE 40 MG PO CPDR: 40.0000 mg | DELAYED_RELEASE_CAPSULE | Freq: Every day | ORAL | 3 refills | Status: DC

## 2017-10-26 NOTE — Progress Notes (Signed)
Jonathon Bellows MD, MRCP(U.K) [address]  [place]  [place], [place] [address]  Main: [phone]  Fax: [phone]   Gastroenterology Consultation  Referring Provider:     Volney American,* Primary Care Physician:  [name], NP Primary Gastroenterologist:  Dr. Jonathon Bellows  Reason for Consultation:     Constipation         HPI:   Diana Daniels is a 42 y.o. y/o female referred for consultation & management  by Dr. [name], NP.    She has been referred for constipation . Labs 10/18/17 shows leucopenia. TSH normal 07/2017 .  She has a leidin V mutation , She has leucopenia and says been low last 10 years- unclear reason.    Constipation: Onset Whole life- no different , better when she stopped gluten when gas and bloating improved  Frequency of bowel movements Every day with laxatives, with no laxatives can go for a months without a bowel movement. Takes "swiss criss". Some form of a natural laxative. "other laxatives cause cramping " .  Consistency : watery , not very hard  Shape none , no new change Bloating/abdominal distension yes  Bleeding yes when she has hard bowel movemnets  Last colonoscopy never  Weight loss none  Family history of colon cancer: none  Diet no fiber.  Narcotic/anticholinergic medications: none  Laxative use yes  Thyroid abnormalities : tested before and was told was ok .    "throat is scratchy is all the time" at night says hard to swallow, solids and liquids , going on for a while. Getting worse . Points to her neck where it gets stuck. Voice is hoarse.   Past Medical History:  Diagnosis Date  . Anxiety   . Attention deficit hyperactivity disorder (ADHD)   . Celiac disease   . Depression   . Factor 5 Leiden mutation, heterozygous ([place])   . Fibromyalgia   . Hypoglycemia   . Raynaud's disease     Past Surgical History:  Procedure Laterality Date  . FOOT SURGERY      Prior to Admission medications     Medication Sig Start Date End Date Taking? Authorizing Provider  amphetamine-dextroamphetamine (ADDERALL) 20 MG tablet Take 1 tablet (20 mg total) by mouth 2 (two) times daily. 03/02/16   [name], NP  buPROPion (WELLBUTRIN) 100 MG tablet Take 100 mg by mouth daily.    [provider]  calcium-vitamin D (OSCAL WITH D) 500-200 MG-UNIT tablet Take 2 tablets by mouth.    [provider]  clonazePAM (KLONOPIN) 1 MG tablet Take 1 tablet (1 mg total) by mouth as needed for anxiety. 03/02/16   [name], NP  Efinaconazole 10 % SOLN Apply 1 application topically daily. Patient not taking: Reported on 08/29/2017 07/28/17   Volney American, PA-C  FLUoxetine (PROZAC) 10 MG capsule  07/28/17   [provider]  FLUoxetine (PROZAC) 20 MG capsule Take 20 mg by mouth daily.    [provider]  Multiple Vitamin (MULTIVITAMIN) capsule Take 1 capsule by mouth daily.    [provider]  rivaroxaban (XARELTO) 20 MG TABS tablet Take 1 tablet (20 mg total) by mouth daily. Patient not taking: Reported on 03/04/2017 11/18/16   Valerie Roys, DO    Family History  Problem Relation Age of Onset  . Clotting disorder Father   . Cancer Maternal Grandmother   . Breast cancer Maternal Grandmother   . Stroke Paternal Grandmother   . Kidney  disease Paternal Grandfather      Social History   Tobacco Use  . Smoking status: Never Smoker  . Smokeless tobacco: Never Used  Substance Use Topics  . Alcohol use: No  . Drug use: No    Allergies as of 10/26/2017  . (No Known Allergies)    Review of Systems:    All systems reviewed and negative except where noted in HPI.   Physical Exam:  There were no vitals taken for this visit. No LMP recorded. Psych:  Alert and cooperative. Normal mood and affect. General:   Alert,  Well-developed, well-nourished, pleasant and cooperative in NAD Head:  Normocephalic and atraumatic. Eyes:  Sclera clear, no icterus.    Conjunctiva pink. Ears:  Normal auditory acuity. Nose:  No deformity, discharge, or lesions. Mouth:  No deformity or lesions,oropharynx pink & moist. Neck:  Supple; no masses or thyromegaly. Lungs:  Respirations even and unlabored.  Clear throughout to auscultation.   No wheezes, crackles, or rhonchi. No acute distress. Heart:  Regular rate and rhythm; no murmurs, clicks, rubs, or gallops. Abdomen:  Normal bowel sounds.  No bruits.  Soft, non-tender and non-distended without masses, hepatosplenomegaly or hernias noted.  No guarding or rebound tenderness.    Msk:  Symmetrical without gross deformities. Good, equal movement & strength bilaterally. Pulses:  Normal pulses noted. Extremities:  No clubbing or edema.  No cyanosis. Neurologic:  Alert and oriented x3;  grossly normal neurologically. Skin:  Intact without significant lesions or rashes. No jaundice. Lymph Nodes:  No significant cervical adenopathy. Psych:  Alert and cooperative. Normal mood and affect.  Imaging Studies: Dg Bone Density  Result Date: 10/11/2017 EXAM: DUAL X-RAY ABSORPTIOMETRY (DXA) FOR BONE MINERAL DENSITY IMPRESSION: Dear Dr Merrie Roof, Your patient [name] completed a BMD test on 10/11/2017 using the [place] (analysis version: 14.10) manufactured by [employer]. The following summarizes the results of our evaluation. PATIENT BIOGRAPHICAL: Name: Pattye, Meda Patient ID: [id number] Birth Date: 09/19/1975 Height: 67.5 in. Gender: Female Exam Date: 10/11/2017 Weight: 130.2 lbs. Indications: arthritis, Caucasian, fibromyalgia, Height Loss, History of Fracture (Adult), Low Body Weight, PREmenopausal Fractures: TOES Treatments: multivitamin ASSESSMENT: The BMD measured at AP Spine L1-L4 is 1.020 g/cm2 with a Z-score of -1.3. The Z-score is within the expected range for age. ISCD recommends using Z-scores for assessment of pre-menopausal women, men between 71 and 6 yoa, and children under 20  yoa. The diagnosis of osteoporosis in these patients should not be made on the basis of densitometric criteria alone. Site Region Measured Measured WHO Age Matched BMD Date       Age      Classification Z-Score AP Spine L1-L4 10/11/2017 43.1 N/A -1.3 1.020 g/cm2 DualFemur Total Right 10/11/2017 43.1 N/A -0.6 0.905 g/cm2 World Health Organization [facility]) criteria for post-menopausal, Caucasian Women: Normal:       T-score at or above -1 SD Osteopenia:   T-score between -1 and -2.5 SD Osteoporosis: T-score at or below -2.5 SD RECOMMENDATIONS: 1. All patients should optimize calcium and vitamin D intake. 2. Patients with diagnosis of osteoporosis or at high risk for fracture should have regular bone mineral density tests. For patients eligible for Medicare, routine testing is allowed once every 2 years. The testing frequency can be increased to one year for patients who have rapidly progressing disease, those who are receiving or discontinuing medical therapy to restore bone mass, or have additional risk factors. FOLLOW-UP: People with diagnosed cases of osteoporosis or osteopenia  should be regularly tested for bone mineral density. For patients eligible for Medicare, routine testing is allowed once every 2 years. The testing frequency can be increased to one year for patients who have rapidly progressing disease, or for those who are receiving medical therapy to restore bone mass. I have reviewed this report, and agree with the above findings. [facility] Electronically Signed   By: Lowella Grip III M.D.   On: 10/11/2017 15:18   Mm Screening Breast Tomo Bilateral  Result Date: 10/11/2017 CLINICAL DATA:  Screening. EXAM: DIGITAL SCREENING BILATERAL MAMMOGRAM WITH TOMO AND CAD COMPARISON:  None. ACR Breast Density Category c: The breast tissue is heterogeneously dense, which may obscure small masses FINDINGS: There are no findings suspicious for malignancy. Images were processed with CAD. IMPRESSION: No  mammographic evidence of malignancy. A result letter of this screening mammogram will be mailed directly to the patient. RECOMMENDATION: Screening mammogram in one year. (Code:SM-B-01Y) BI-RADS CATEGORY  1: Negative. Electronically Signed   By: Lajean Manes M.D.   On: 10/11/2017 15:09    Assessment and Plan:   BARBA SOLT is a 42 y.o. y/o female has been referred for constipation . She says she is "fine with what I am taking for constipation ". Does not wish to change therapy. Her diet is very low in fiber . Likely contributing to the issue. She also complains of dysphagia.   Plan  1. High fiber diet patient information provided.  2. Trial of Prilosec  3. EGD and colonoscopy - will get ok from Dr Grayland Ormond due to low white cell count   I have discussed alternative options, risks & benefits,  which include, but are not limited to, bleeding, infection, perforation,respiratory complication & drug reaction.  The patient agrees with this plan & written consent will be obtained.    Follow up in 12 weeks   Dr Jonathon Bellows MD,MRCP(U.K)

## 2017-10-26 NOTE — Addendum Note (Signed)
Addended by: Peggye Ley on: 10/26/2017 11:56 AM   Modules accepted: Orders, SmartSet

## 2017-10-27 ENCOUNTER — Telehealth: Payer: Self-pay

## 2017-10-27 NOTE — Telephone Encounter (Signed)
Contacted Dr. Grayland Ormond concerning procedure clearance for the patient.   From: Lloyd Huger, MD  Sent: 10/26/2017 12:19 PM  To: Leontine Locket, CMA  Subject: RE: Clinical                   Patient has no been seen in nearly 18 months.   Dr. Vicente Males requests:  Let her get seen by oncology and get evaluated to get an OK from them for the low white cell count before we proceed with colonoscopy.  Contacted patient and advised her that a pre-op clearance would need to be obtained from Dr. Grayland Ormond before we could proceed. Patient agreed to schedule appointment.   Patient to callback when appointment is scheduled. Based on the date her procedure may need to be rescheduled.

## 2017-11-06 NOTE — Progress Notes (Signed)
[place]  [phone] [phone]  ID: Diana Daniels OB: November 17, 1975  MR#: [id number]  FGH#:[id number]  Patient Care Team: Diana Haddock, NP as PCP - General (Nurse Practitioner)  CHIEF COMPLAINT: Leukopenia.  INTERVAL HISTORY: Patient was last evaluated in clinic in December 2017.  She returns to clinic today for evaluation prior to undergoing colonoscopy.  She continues to be neutropenic.  She reports multiple symptoms over the past 12 months which include intermittent fever, chills, night sweats.  She is also had several infections including a groin abscess, possible pneumonia, ear infection, and sinus infection.  She also has multiple other nonspecific symptoms.  Currently she feels well. She has a good appetite and denies weight loss. She denies any pain. She has no neurologic complaints. She denies any chest pain or shortness of breath.  She has occasional abdominal pain associated with diarrhea and vomiting.  She has no current urinary complaints. Patient offers no further specific complaints today.  REVIEW OF SYSTEMS:   Review of Systems  Constitutional: Positive for chills, diaphoresis and fever. Negative for malaise/fatigue and weight loss.  Respiratory: Positive for cough and shortness of breath.   Cardiovascular: Negative.  Negative for chest pain and leg swelling.  Gastrointestinal: Positive for abdominal pain, diarrhea and vomiting.  Genitourinary: Positive for dysuria. Negative for hematuria.  Musculoskeletal: Negative.  Negative for myalgias.  Skin: Negative.  Negative for rash.  Neurological: Negative.  Negative for sensory change, focal weakness and weakness.  Psychiatric/Behavioral: Negative.  The patient is not nervous/anxious.     As per HPI. Otherwise, a complete review of systems is negative.  PAST MEDICAL HISTORY: Past Medical History:  Diagnosis Date  . Anxiety   . Attention deficit hyperactivity disorder (ADHD)   .  Celiac disease   . Depression   . Factor 5 Leiden mutation, heterozygous ([place])   . Fibromyalgia   . Hypoglycemia   . Raynaud's disease     PAST SURGICAL HISTORY: Past Surgical History:  Procedure Laterality Date  . FOOT SURGERY      FAMILY HISTORY: Family History  Problem Relation Age of Onset  . Clotting disorder Father   . Cancer Maternal Grandmother   . Breast cancer Maternal Grandmother   . Stroke Paternal Grandmother   . Kidney disease Paternal Grandfather        ADVANCED DIRECTIVES (Y/N):  N   HEALTH MAINTENANCE: Social History   Tobacco Use  . Smoking status: Never Smoker  . Smokeless tobacco: Never Used  Substance Use Topics  . Alcohol use: No  . Drug use: No     Colonoscopy:  PAP:  Bone density:  Lipid panel:  Allergies  Allergen Reactions  . Propofol Other (See Comments)    Nervous System malfunction    Current Outpatient Medications  Medication Sig Dispense Refill  . amphetamine-dextroamphetamine (ADDERALL) 20 MG tablet Take 1 tablet (20 mg total) by mouth 2 (two) times daily. 60 tablet 0  . buPROPion (WELLBUTRIN) 100 MG tablet Take 100 mg by mouth daily.    . calcium-vitamin D (OSCAL WITH D) 500-200 MG-UNIT tablet Take 2 tablets by mouth.    . clonazePAM (KLONOPIN) 1 MG tablet Take 1 tablet (1 mg total) by mouth as needed for anxiety. 30 tablet 0  . Efinaconazole 10 % SOLN Apply 1 application topically daily. 8 mL 12  . FLUoxetine (PROZAC) 10 MG capsule   0  . FLUoxetine (PROZAC) 20 MG capsule Take 20 mg by mouth daily.    Marland Kitchen  Multiple Vitamin (MULTIVITAMIN) capsule Take 1 capsule by mouth daily.    Marland Kitchen omeprazole (PRILOSEC) 40 MG capsule Take 1 capsule (40 mg total) by mouth daily. 90 capsule 3  . rivaroxaban (XARELTO) 20 MG TABS tablet Take 1 tablet (20 mg total) by mouth daily. 30 tablet 1  . tiZANidine (ZANAFLEX) 2 MG tablet Take 1 tablet by mouth daily.     No current facility-administered medications for this visit.      OBJECTIVE: Vitals:   11/10/17 1026 11/10/17 1037  BP:  99/71  Pulse:  (!) 118  Resp: 12   Temp:  97.7 F (36.5 C)     Body mass index is 19.8 kg/m.    ECOG FS:0 - Asymptomatic  General: Well-developed, well-nourished, no acute distress. Eyes: Pink conjunctiva, anicteric sclera. HEENT: Normocephalic, moist mucous membranes, clear oropharnyx. Lungs: Clear to auscultation bilaterally. Heart: Regular rate and rhythm. No rubs, murmurs, or gallops. Abdomen: Soft, nontender, nondistended. No organomegaly noted, normoactive bowel sounds. Musculoskeletal: No edema, cyanosis, or clubbing. Neuro: Alert, answering all questions appropriately. Cranial nerves grossly intact. Skin: No rashes or petechiae noted. Psych: Normal affect. Lymphatics: No cervical, calvicular, axillary or inguinal LAD.  LAB RESULTS:  Lab Results  Component Value Date   NA 137 07/28/2017   K 4.3 07/28/2017   CL 97 07/28/2017   CO2 26 07/28/2017   GLUCOSE 85 07/28/2017   BUN 9 07/28/2017   CREATININE 0.65 07/28/2017   CALCIUM 9.5 07/28/2017   PROT 7.1 07/28/2017   ALBUMIN 4.5 07/28/2017   AST 18 07/28/2017   ALT 13 07/28/2017   ALKPHOS 76 07/28/2017   BILITOT 0.4 07/28/2017   GFRNONAA 110 07/28/2017   GFRAA 127 07/28/2017    Lab Results  Component Value Date   WBC 1.8 (LL) 10/18/2017   NEUTROABS 0.3 (LL) 10/18/2017   HGB 14.8 10/18/2017   HCT 43.2 10/18/2017   MCV 94 10/18/2017   PLT 233 10/18/2017   Lab Results  Component Value Date   IRON 111 03/24/2016   TIBC 315 03/24/2016   IRONPCTSAT 35 (H) 03/24/2016   Lab Results  Component Value Date   FERRITIN 33 03/24/2016     STUDIES: Dg Bone Density  Result Date: 10/11/2017 EXAM: DUAL X-RAY ABSORPTIOMETRY (DXA) FOR BONE MINERAL DENSITY IMPRESSION: Dear Dr Diana Daniels, Your patient Diana Daniels completed a BMD test on 10/11/2017 using the [place] (analysis version: 14.10) manufactured by [employer]. The  following summarizes the results of our evaluation. PATIENT BIOGRAPHICAL: Name: Diana, Daniels Patient ID: [id number] Birth Date: 04-27-1976 Height: 67.5 in. Gender: Female Exam Date: 10/11/2017 Weight: 130.2 lbs. Indications: arthritis, Caucasian, fibromyalgia, Height Loss, History of Fracture (Adult), Low Body Weight, PREmenopausal Fractures: TOES Treatments: multivitamin ASSESSMENT: The BMD measured at AP Spine L1-L4 is 1.020 g/cm2 with a Z-score of -1.3. The Z-score is within the expected range for age. ISCD recommends using Z-scores for assessment of pre-menopausal women, men between 2 and 7 yoa, and children under 59 yoa. The diagnosis of osteoporosis in these patients should not be made on the basis of densitometric criteria alone. Site Region Measured Measured WHO Age Matched BMD Date       Age      Classification Z-Score AP Spine L1-L4 10/11/2017 43.1 N/A -1.3 1.020 g/cm2 DualFemur Total Right 10/11/2017 43.1 N/A -0.6 0.905 g/cm2 World Health Organization [facility]) criteria for post-menopausal, Caucasian Women: Normal:       T-score at or above -1 SD Osteopenia:   T-score  between -1 and -2.5 SD Osteoporosis: T-score at or below -2.5 SD RECOMMENDATIONS: 1. All patients should optimize calcium and vitamin D intake. 2. Patients with diagnosis of osteoporosis or at high risk for fracture should have regular bone mineral density tests. For patients eligible for Medicare, routine testing is allowed once every 2 years. The testing frequency can be increased to one year for patients who have rapidly progressing disease, those who are receiving or discontinuing medical therapy to restore bone mass, or have additional risk factors. FOLLOW-UP: People with diagnosed cases of osteoporosis or osteopenia should be regularly tested for bone mineral density. For patients eligible for Medicare, routine testing is allowed once every 2 years. The testing frequency can be increased to one year for patients who have rapidly  progressing disease, or for those who are receiving medical therapy to restore bone mass. I have reviewed this report, and agree with the above findings. [facility] Radiology Electronically Signed   By: Lowella Grip III M.D.   On: 10/11/2017 15:18   Mm Screening Breast Tomo Bilateral  Result Date: 10/11/2017 CLINICAL DATA:  Screening. EXAM: DIGITAL SCREENING BILATERAL MAMMOGRAM WITH TOMO AND CAD COMPARISON:  None. ACR Breast Density Category c: The breast tissue is heterogeneously dense, which may obscure small masses FINDINGS: There are no findings suspicious for malignancy. Images were processed with CAD. IMPRESSION: No mammographic evidence of malignancy. A result letter of this screening mammogram will be mailed directly to the patient. RECOMMENDATION: Screening mammogram in one year. (Code:SM-B-01Y) BI-RADS CATEGORY  1: Negative. Electronically Signed   By: Lajean Manes M.D.   On: 10/11/2017 15:09    ASSESSMENT: Leukopenia.  PLAN:    1. Leukopenia: Chronic. Unclear etiology but patient's white blood cell count has been essentially unchanged since September 2010. She had a normal bone marrow biopsy at that time and her white blood cell count was 2.1. She had an abdominal ultrasound in August 2017 did not reveal splenomegaly.  Previously, all of the patient's other laboratory work was either negative or within normal limits. Neutrophil antibodies and peripheral blood flow cytometry revealed no significant diagnostic immunophenotypic abnormality.  Because patient is intermittently symptomatic and persistently neutropenic, she will benefit from daily 480 mcg Granix for 3 or 4 days leading up to her colonoscopy.  This should adequately improve her white blood cell count thereby reducing her risk of infection or complication if patient requires biopsy. 2.  Heterozygote factor V Leiden: Patient reports she has Lovenox at home and takes it prior to extended travel.  Her last DVT was in 1999.  3.   Disposition: No follow-up has been scheduled at this time.  Approximately 30 minutes was spent in discussion of which greater than 50% was consultation.  Patient expressed understanding and was in agreement with this plan. She also understands that She can call clinic at any time with any questions, concerns, or complaints.    Lloyd Huger, MD 11/10/17 2:12 PM

## 2017-11-09 DIAGNOSIS — R768 Other specified abnormal immunological findings in serum: Secondary | ICD-10-CM | POA: Insufficient documentation

## 2017-11-10 ENCOUNTER — Encounter: Payer: Self-pay | Admitting: Oncology

## 2017-11-10 ENCOUNTER — Other Ambulatory Visit: Payer: Self-pay

## 2017-11-10 ENCOUNTER — Telehealth: Payer: Self-pay | Admitting: Gastroenterology

## 2017-11-10 ENCOUNTER — Inpatient Hospital Stay: Payer: BLUE CROSS/BLUE SHIELD | Attending: Oncology | Admitting: Oncology

## 2017-11-10 VITALS — BP 99/71 | HR 118 | Temp 97.7°F | Resp 12 | Ht 67.0 in | Wt 126.4 lb

## 2017-11-10 DIAGNOSIS — Z79899 Other long term (current) drug therapy: Secondary | ICD-10-CM | POA: Insufficient documentation

## 2017-11-10 DIAGNOSIS — D6851 Activated protein C resistance: Secondary | ICD-10-CM

## 2017-11-10 DIAGNOSIS — D709 Neutropenia, unspecified: Secondary | ICD-10-CM | POA: Insufficient documentation

## 2017-11-10 DIAGNOSIS — D72819 Decreased white blood cell count, unspecified: Secondary | ICD-10-CM

## 2017-11-10 DIAGNOSIS — Z86718 Personal history of other venous thrombosis and embolism: Secondary | ICD-10-CM | POA: Diagnosis not present

## 2017-11-10 NOTE — Telephone Encounter (Signed)
Patient also needs her prep called in

## 2017-11-10 NOTE — Telephone Encounter (Signed)
Pt states she did not receive her rx for procedure she would like to see if there are other dates available  For the procedure  As well please call pt

## 2017-11-10 NOTE — Telephone Encounter (Signed)
Patient left a voice message that she needs to get clearance first for her procedure and needs to reschedule. Please call

## 2017-11-10 NOTE — Progress Notes (Signed)
She has multiple complaints of infections,  She has an abscess in her right  groin, night sweats abdominal pain. She has seen her PCP about the abscess. Constant headache, bleeding gums she has seen her dentist.

## 2017-11-11 ENCOUNTER — Other Ambulatory Visit: Payer: Self-pay

## 2017-11-11 DIAGNOSIS — R1310 Dysphagia, unspecified: Secondary | ICD-10-CM

## 2017-11-11 DIAGNOSIS — K5900 Constipation, unspecified: Secondary | ICD-10-CM

## 2017-11-11 MED ORDER — PEG 3350-KCL-NA BICARB-NACL 420 G PO SOLR: 4000.0000 mL | Freq: Once | ORAL | 0 refills | Status: AC

## 2017-11-14 ENCOUNTER — Other Ambulatory Visit: Payer: Self-pay

## 2017-11-14 ENCOUNTER — Telehealth: Payer: Self-pay | Admitting: Gastroenterology

## 2017-11-14 ENCOUNTER — Telehealth: Payer: Self-pay | Admitting: *Deleted

## 2017-11-14 DIAGNOSIS — Z01812 Encounter for preprocedural laboratory examination: Secondary | ICD-10-CM

## 2017-11-14 NOTE — Telephone Encounter (Signed)
Patient called today requesting updates regarding neupogen and upcoming procedure. Per Dr. Grayland Ormond patient will benefit from 4 days of neupogen prior to procedure. Patients procedure to be rescheduled and she will be given appointments for neupogen prior to procedure. Tonya with Dr. Georgeann Oppenheim office will call and let us know what date patient is rescheduled for.

## 2017-11-14 NOTE — Telephone Encounter (Signed)
Diana Daniels from Cancer center left vm in regards to pt she states pt procedure for tomorrow was supposed to be cancelled and it is still on with Endo please call Diana Daniels at [phone]  Pt needs injection so its important to get back with her

## 2017-11-15 MED ORDER — PROPOFOL 10 MG/ML IV BOLUS
INTRAVENOUS | Status: AC
  Filled 2017-11-15: qty 80

## 2017-11-22 ENCOUNTER — Telehealth: Payer: Self-pay | Admitting: Gastroenterology

## 2017-11-22 NOTE — Telephone Encounter (Signed)
Pt left vm to  Speak to someone in regards to her upcoming procedure 12/01/17 she states she has been calling and waiting on someone to call her please call pt

## 2017-11-25 ENCOUNTER — Telehealth: Payer: Self-pay | Admitting: *Deleted

## 2017-11-25 NOTE — Telephone Encounter (Signed)
Patient is scheduled for colonoscopy with Dr. Vicente Males on Thursday, 5/2. Patient called in to let us know of scheduled procedure so we could get injections for neupogen arranged 3 days prior to procedure. Patient was scheduled for neupogen on 4/29, 4/30 and 5/1 in preparation for procedure on 5/2. I spoke with patient today and she expressed concerns about the costs and financial burden of the injections. I spoke with Aleen Sells in our authorization department and verified costs and insurance benefits for patient. The estimated total per day for the injections and fees is $1450.00. This information was relayed to the patient and she cannot afford to take on cost of the 3 injections. I spoke with Dr. Grayland Ormond, he recommends proceeding with colonoscopy on 5/2 without neupogen injections. Patient has been informed and verbalizes understanding of plan.

## 2017-11-28 ENCOUNTER — Inpatient Hospital Stay: Payer: BLUE CROSS/BLUE SHIELD

## 2017-11-29 ENCOUNTER — Inpatient Hospital Stay: Payer: BLUE CROSS/BLUE SHIELD

## 2017-11-30 ENCOUNTER — Other Ambulatory Visit: Payer: Self-pay

## 2017-11-30 ENCOUNTER — Ambulatory Visit: Payer: Self-pay

## 2017-11-30 ENCOUNTER — Telehealth: Payer: Self-pay | Admitting: Gastroenterology

## 2017-11-30 DIAGNOSIS — K5900 Constipation, unspecified: Secondary | ICD-10-CM

## 2017-11-30 DIAGNOSIS — Z01812 Encounter for preprocedural laboratory examination: Secondary | ICD-10-CM

## 2017-11-30 MED ORDER — PEG 3350-KCL-NA BICARB-NACL 420 G PO SOLR
4000.0000 mL | Freq: Once | ORAL | 0 refills | Status: AC
Start: 2017-11-30 — End: 2017-11-30

## 2017-11-30 NOTE — Telephone Encounter (Signed)
Pt has procedure tomorrow and had already mixed the Gallon jug 2 weeks ago she states she looked online and was supposed to wait til 48 hour before procedure to mix it please send new rx for pt and call her when done so she can go pick it up

## 2017-12-01 ENCOUNTER — Encounter: Payer: Self-pay | Admitting: Certified Registered Nurse Anesthetist

## 2017-12-01 ENCOUNTER — Ambulatory Visit: Payer: BLUE CROSS/BLUE SHIELD | Admitting: Certified Registered Nurse Anesthetist

## 2017-12-01 ENCOUNTER — Ambulatory Visit
Admission: RE | Admit: 2017-12-01 | Discharge: 2017-12-01 | Disposition: A | Discharge status: Home / Self Care | Payer: BLUE CROSS/BLUE SHIELD | Source: Ambulatory Visit | Attending: Gastroenterology | Admitting: Gastroenterology

## 2017-12-01 ENCOUNTER — Encounter
Admission: RE | Disposition: A | Discharge status: Home / Self Care | Payer: Self-pay | Source: Ambulatory Visit | Attending: Gastroenterology

## 2017-12-01 DIAGNOSIS — F419 Anxiety disorder, unspecified: Secondary | ICD-10-CM | POA: Insufficient documentation

## 2017-12-01 DIAGNOSIS — Z79899 Other long term (current) drug therapy: Secondary | ICD-10-CM | POA: Insufficient documentation

## 2017-12-01 DIAGNOSIS — M797 Fibromyalgia: Secondary | ICD-10-CM | POA: Diagnosis not present

## 2017-12-01 DIAGNOSIS — D124 Benign neoplasm of descending colon: Secondary | ICD-10-CM | POA: Diagnosis not present

## 2017-12-01 DIAGNOSIS — K633 Ulcer of intestine: Secondary | ICD-10-CM | POA: Insufficient documentation

## 2017-12-01 DIAGNOSIS — D6851 Activated protein C resistance: Secondary | ICD-10-CM | POA: Insufficient documentation

## 2017-12-01 DIAGNOSIS — I7300 Raynaud's syndrome without gangrene: Secondary | ICD-10-CM | POA: Insufficient documentation

## 2017-12-01 DIAGNOSIS — R1310 Dysphagia, unspecified: Secondary | ICD-10-CM | POA: Insufficient documentation

## 2017-12-01 DIAGNOSIS — K5900 Constipation, unspecified: Secondary | ICD-10-CM | POA: Insufficient documentation

## 2017-12-01 DIAGNOSIS — F909 Attention-deficit hyperactivity disorder, unspecified type: Secondary | ICD-10-CM | POA: Diagnosis not present

## 2017-12-01 DIAGNOSIS — K228 Other specified diseases of esophagus: Secondary | ICD-10-CM | POA: Diagnosis not present

## 2017-12-01 DIAGNOSIS — Z7901 Long term (current) use of anticoagulants: Secondary | ICD-10-CM | POA: Insufficient documentation

## 2017-12-01 DIAGNOSIS — F329 Major depressive disorder, single episode, unspecified: Secondary | ICD-10-CM | POA: Insufficient documentation

## 2017-12-01 HISTORY — PX: COLONOSCOPY: SHX5424

## 2017-12-01 HISTORY — PX: ESOPHAGOGASTRODUODENOSCOPY: SHX5428

## 2017-12-01 HISTORY — DX: Adverse effect of unspecified anesthetic, initial encounter: T41.45XA

## 2017-12-01 HISTORY — DX: Other complications of anesthesia, initial encounter: T88.59XA

## 2017-12-01 LAB — POCT PREGNANCY, URINE: PREG TEST UR: NEGATIVE

## 2017-12-01 SURGERY — EGD (ESOPHAGOGASTRODUODENOSCOPY)
Anesthesia: General

## 2017-12-01 MED ORDER — PROPOFOL 500 MG/50ML IV EMUL
INTRAVENOUS | Status: AC
  Filled 2017-12-01: qty 50

## 2017-12-01 MED ORDER — MIDAZOLAM HCL 2 MG/2ML IJ SOLN
INTRAMUSCULAR | Status: AC
  Filled 2017-12-01: qty 2

## 2017-12-01 MED ORDER — PROPOFOL 500 MG/50ML IV EMUL
INTRAVENOUS | Status: DC | PRN
  Administered 2017-12-01: 130 ug/kg/min via INTRAVENOUS

## 2017-12-01 MED ORDER — MIDAZOLAM HCL 2 MG/2ML IJ SOLN
INTRAMUSCULAR | Status: DC | PRN
  Administered 2017-12-01: 2 mg via INTRAVENOUS

## 2017-12-01 MED ORDER — SODIUM CHLORIDE 0.9 % IV SOLN
INTRAVENOUS | Status: DC
  Administered 2017-12-01: 1000 mL via INTRAVENOUS

## 2017-12-01 MED ORDER — PROPOFOL 10 MG/ML IV BOLUS
INTRAVENOUS | Status: DC | PRN
  Administered 2017-12-01: 90 mg via INTRAVENOUS

## 2017-12-01 MED ORDER — LIDOCAINE HCL (CARDIAC) PF 100 MG/5ML IV SOSY
PREFILLED_SYRINGE | INTRAVENOUS | Status: DC | PRN
  Administered 2017-12-01: 50 mg via INTRAVENOUS

## 2017-12-01 MED ORDER — LIDOCAINE HCL (PF) 1 % IJ SOLN
INTRAMUSCULAR | Status: AC
  Administered 2017-12-01: 0.3 mL via INTRADERMAL
  Filled 2017-12-01: qty 2

## 2017-12-01 MED ORDER — LIDOCAINE HCL (PF) 2 % IJ SOLN
INTRAMUSCULAR | Status: AC
  Filled 2017-12-01: qty 10

## 2017-12-01 MED ORDER — LIDOCAINE HCL (PF) 1 % IJ SOLN
2.0000 mL | Freq: Once | INTRAMUSCULAR | Status: AC
  Administered 2017-12-01: 0.3 mL via INTRADERMAL

## 2017-12-01 NOTE — Anesthesia Preprocedure Evaluation (Signed)
Anesthesia Evaluation  Patient identified by MRN, date of birth, ID band Patient awake    Reviewed: Allergy & Precautions, NPO status , Patient's Chart, lab work & pertinent test results  History of Anesthesia Complications (+) history of anesthetic complications (Pt with chills and body aches after last EGD. )  Airway Mallampati: II       Dental   Pulmonary neg sleep apnea, neg COPD,           Cardiovascular (-) hypertension+ Peripheral Vascular Disease  (-) Past MI and (-) CHF (-) dysrhythmias (-) Valvular Problems/Murmurs     Neuro/Psych neg Seizures Anxiety Depression    GI/Hepatic Neg liver ROS, neg GERD  ,  Endo/Other  neg diabetes  Renal/GU negative Renal ROS     Musculoskeletal   Abdominal   Peds  Hematology   Anesthesia Other Findings   Reproductive/Obstetrics                             Anesthesia Physical Anesthesia Plan  ASA: II  Anesthesia Plan: General   Post-op Pain Management:    Induction:   PONV Risk Score and Plan: 3 and Midazolam, TIVA and Propofol infusion  Airway Management Planned: Nasal Cannula  Additional Equipment:   Intra-op Plan:   Post-operative Plan:   Informed Consent: I have reviewed the patients History and Physical, chart, labs and discussed the procedure including the risks, benefits and alternatives for the proposed anesthesia with the patient or authorized representative who has indicated his/her understanding and acceptance.     Plan Discussed with:   Anesthesia Plan Comments:         Anesthesia Quick Evaluation

## 2017-12-01 NOTE — Anesthesia Post-op Follow-up Note (Signed)
Anesthesia QCDR form completed.        

## 2017-12-01 NOTE — Op Note (Addendum)
[facility] Gastroenterology Patient Name: Diana Daniels Procedure Date: 12/01/2017 9:30 AM MRN: [id number] Account #: [id number] Date of Birth: [birth year] Admit Type: Outpatient Age: 42 Room: [facility] ENDO ROOM 3 Gender: Female Note Status: Finalized Procedure:            Upper GI endoscopy Indications:          Dysphagia Providers:            Jonathon Bellows MD, MD Referring MD:         [name] (Referring MD) Medicines:            Monitored Anesthesia Care Complications:        No immediate complications. Procedure:            Pre-Anesthesia Assessment:                       - Prior to the procedure, a History and Physical was                        performed, and patient medications, allergies and                        sensitivities were reviewed. The patient's tolerance of                        previous anesthesia was reviewed.                       - The risks and benefits of the procedure and the                        sedation options and risks were discussed with the                        patient. All questions were answered and informed                        consent was obtained.                       - ASA Grade Assessment: II - A patient with mild                        systemic disease.                       After obtaining informed consent, the endoscope was                        passed under direct vision. Throughout the procedure,                        the patient's blood pressure, pulse, and oxygen                        saturations were monitored continuously. The Endoscope                        was introduced through the mouth, and advanced to the  third part of duodenum. The upper GI endoscopy was                        accomplished with ease. The patient tolerated the                        procedure well. Findings:      The examined duodenum was normal.      The stomach was normal.      The examined esophagus was  normal. Biopsies were taken with a cold       forceps for histology.      The cardia and gastric fundus were normal on retroflexion. Impression:           - Normal examined duodenum.                       - Normal stomach.                       - Normal esophagus. Biopsied. Recommendation:       - Discharge patient to home (with escort).                       - Resume previous diet.                       - Continue present medications.                       - Await pathology results.                       - Return to my office in 6 weeks. Procedure Code(s):    --- Professional ---                       [phone], Esophagogastroduodenoscopy, flexible, transoral;                        with biopsy, single or multiple Diagnosis Code(s):    --- Professional ---                       R13.10, Dysphagia, unspecified CPT copyright 2017 American Medical Association. All rights reserved. The codes documented in this report are preliminary and upon coder review may  be revised to meet current compliance requirements. Jonathon Bellows, MD Jonathon Bellows MD, MD 12/01/2017 10:02:09 AM This report has been signed electronically. Number of Addenda: 0 Note Initiated On: 12/01/2017 9:30 AM      [facility]

## 2017-12-01 NOTE — Transfer of Care (Signed)
Immediate Anesthesia Transfer of Care Note  Patient: Diana Daniels  Procedure(s) Performed: ESOPHAGOGASTRODUODENOSCOPY (EGD) (N/A ) COLONOSCOPY (N/A )  Patient Location: PACU and Endoscopy Unit  Anesthesia Type:General  Level of Consciousness: drowsy  Airway & Oxygen Therapy: Patient Spontanous Breathing and Patient connected to nasal cannula oxygen  Post-op Assessment: Report given to RN and Post -op Vital signs reviewed and stable  Post vital signs: Reviewed and stable  Last Vitals:  Vitals Value Taken Time  BP 97/67 12/01/2017 10:05 AM  Temp 36.3 C 12/01/2017 10:04 AM  Pulse 67 12/01/2017 10:05 AM  Resp 18 12/01/2017 10:05 AM  SpO2 100 % 12/01/2017 10:05 AM  Vitals shown include unvalidated device data.  Last Pain:  Vitals:   12/01/17 1004  TempSrc: Tympanic  PainSc: 0-No pain         Complications: No apparent anesthesia complications

## 2017-12-01 NOTE — Op Note (Signed)
[facility] Gastroenterology Patient Name: Diana Daniels Procedure Date: 12/01/2017 9:29 AM MRN: [id number] Account #: [id number] Date of Birth: 1975-11-17 Admit Type: Outpatient Age: 42 Room: [facility] ENDO ROOM 3 Gender: Female Note Status: Finalized Procedure:            Colonoscopy Indications:          Constipation Providers:            Jonathon Bellows MD, MD Referring MD:         [name] (Referring MD) Medicines:            Monitored Anesthesia Care Complications:        No immediate complications. Procedure:            Pre-Anesthesia Assessment:                       - Prior to the procedure, a History and Physical was                        performed, and patient medications, allergies and                        sensitivities were reviewed. The patient's tolerance of                        previous anesthesia was reviewed.                       - The risks and benefits of the procedure and the                        sedation options and risks were discussed with the                        patient. All questions were answered and informed                        consent was obtained.                       - ASA Grade Assessment: II - A patient with mild                        systemic disease.                       After obtaining informed consent, the colonoscope was                        passed under direct vision. Throughout the procedure,                        the patient's blood pressure, pulse, and oxygen                        saturations were monitored continuously. The                        Colonoscope was introduced through the anus and                        advanced to the the  cecum, identified by the                        appendiceal orifice, IC valve and transillumination.                        The colonoscopy was performed with ease. The patient                        tolerated the procedure well. The quality of the bowel       preparation was good. Findings:      The perianal and digital rectal examinations were normal.      A single shallow ulcer with surrounding inflammatiuon were present in       the proximal ascending colon over a single fold. Biopsies were taken       with a cold forceps for histology.      A 3 mm polyp was found in the sigmoid colon. The polyp was sessile. The       polyp was removed with a cold biopsy forceps. Resection and retrieval       were complete.      The exam was otherwise without abnormality on direct and retroflexion       views. Impression:           - Mucosal ulceration. Biopsied.                       - One 3 mm polyp in the sigmoid colon, removed with a                        cold biopsy forceps. Resected and retrieved.                       - The examination was otherwise normal on direct and                        retroflexion views. Recommendation:       - Await pathology results.                       - Perform an upper GI endoscopy today. Procedure Code(s):    --- Professional ---                       [phone], Colonoscopy, flexible; with biopsy, single or                        multiple Diagnosis Code(s):    --- Professional ---                       K63.3, Ulcer of intestine                       D12.5, Benign neoplasm of sigmoid colon                       K59.00, Constipation, unspecified CPT copyright 2017 American Medical Association. All rights reserved. The codes documented in this report are preliminary and upon coder review may  be revised to meet current compliance requirements. Jonathon Bellows, MD Jonathon Bellows MD, MD 12/01/2017 9:55:08 AM This report has been signed electronically. Number of  Addenda: 0 Note Initiated On: 12/01/2017 9:29 AM Scope Withdrawal Time: 0 hours 13 minutes 1 second  Total Procedure Duration: 0 hours 18 minutes 30 seconds       [facility]

## 2017-12-01 NOTE — H&P (Signed)
Jonathon Bellows, MD [address] [place], [place], [place], [address] [address], [place], [place], [place], [address] Phone: [phone]  Fax: [phone]  Primary Care Physician:  [name], NP   Pre-Procedure History & Physical: HPI:  Diana Daniels is a 42 y.o. female is here for an endoscopy and colonoscopy    Past Medical History:  Diagnosis Date  . Anxiety   . Attention deficit hyperactivity disorder (ADHD)   . Celiac disease   . Complication of anesthesia    Propofol intolerance  . Depression   . Factor 5 Leiden mutation, heterozygous ([place])   . Fibromyalgia   . Hypoglycemia   . Raynaud's disease     Past Surgical History:  Procedure Laterality Date  . FOOT SURGERY      Prior to Admission medications   Medication Sig Start Date End Date Taking? Authorizing Provider  amphetamine-dextroamphetamine (ADDERALL) 20 MG tablet Take 1 tablet (20 mg total) by mouth 2 (two) times daily. 03/02/16   [name], NP  buPROPion (WELLBUTRIN) 100 MG tablet Take 100 mg by mouth daily.    [provider]  calcium-vitamin D (OSCAL WITH D) 500-200 MG-UNIT tablet Take 2 tablets by mouth.    [provider]  clonazePAM (KLONOPIN) 1 MG tablet Take 1 tablet (1 mg total) by mouth as needed for anxiety. 03/02/16   [name], NP  Efinaconazole 10 % SOLN Apply 1 application topically daily. 07/28/17   Volney American, PA-C  FLUoxetine (PROZAC) 10 MG capsule  07/28/17   [provider]  FLUoxetine (PROZAC) 20 MG capsule Take 20 mg by mouth daily.    [provider]  Multiple Vitamin (MULTIVITAMIN) capsule Take 1 capsule by mouth daily.    [provider]  omeprazole (PRILOSEC) 40 MG capsule Take 1 capsule (40 mg total) by mouth daily. 10/26/17   Jonathon Bellows, MD  rivaroxaban (XARELTO) 20 MG TABS tablet Take 1 tablet (20 mg total) by mouth daily. 11/18/16   Johnson, Megan P, DO  tiZANidine (ZANAFLEX) 2 MG tablet Take 1 tablet  by mouth daily. 11/09/17   [provider]    Allergies as of 10/26/2017 - Review Complete 10/26/2017  Allergen Reaction Noted  . Propofol Other (See Comments) 10/26/2017    Family History  Problem Relation Age of Onset  . Clotting disorder Father   . Cancer Maternal Grandmother   . Breast cancer Maternal Grandmother   . Stroke Paternal Grandmother   . Kidney disease Paternal Grandfather     Social History   Socioeconomic History  . Marital status: Single    Spouse name: Not on file  . Number of children: Not on file  . Years of education: Not on file  . Highest education level: Not on file  Occupational History  . Not on file  Social Needs  . Financial resource strain: Not on file  . Food insecurity:    Worry: Not on file    Inability: Not on file  . Transportation needs:    Medical: Not on file    Non-medical: Not on file  Tobacco Use  . Smoking status: Never Smoker  . Smokeless tobacco: Never Used  Substance and Sexual Activity  . Alcohol use: No  . Drug use: No  . Sexual activity: Not Currently  Lifestyle  . Physical activity:    Days per week: Not on file    Minutes per session: Not on file  . Stress: Not on  file  Relationships  . Social connections:    Talks on phone: Not on file    Gets together: Not on file    Attends religious service: Not on file    Active member of club or organization: Not on file    Attends meetings of clubs or organizations: Not on file    Relationship status: Not on file  . Intimate partner violence:    Fear of current or ex partner: Not on file    Emotionally abused: Not on file    Physically abused: Not on file    Forced sexual activity: Not on file  Other Topics Concern  . Not on file  Social History Narrative  . Not on file    Review of Systems: See HPI, otherwise negative ROS  Physical Exam: BP 90/65   Pulse 88   Temp (!) 96.2 F (35.7 C) (Tympanic)   Resp 17   Ht 5\' 9"  (1.753 m)   Wt 125 lb (56.7  kg)   SpO2 100%   BMI 18.46 kg/m  General:   Alert,  pleasant and cooperative in NAD Head:  Normocephalic and atraumatic. Neck:  Supple; no masses or thyromegaly. Lungs:  Clear throughout to auscultation, normal respiratory effort.    Heart:  +S1, +S2, Regular rate and rhythm, No edema. Abdomen:  Soft, nontender and nondistended. Normal bowel sounds, without guarding, and without rebound.   Neurologic:  Alert and  oriented x4;  grossly normal neurologically.  Impression/Plan: Diana Daniels is here for an endoscopy and colonoscopy  to be performed for  evaluation of dysphagia and constipation    Risks, benefits, limitations, and alternatives regarding endoscopy have been reviewed with the patient.  Questions have been answered.  All parties agreeable.   Jonathon Bellows, MD  12/01/2017, 8:42 AM

## 2017-12-01 NOTE — Anesthesia Postprocedure Evaluation (Signed)
Anesthesia Post Note  Patient: Diana Daniels  Procedure(s) Performed: ESOPHAGOGASTRODUODENOSCOPY (EGD) (N/A ) COLONOSCOPY (N/A )  Patient location during evaluation: Endoscopy Anesthesia Type: General Level of consciousness: awake and alert Pain management: pain level controlled Vital Signs Assessment: post-procedure vital signs reviewed and stable Respiratory status: spontaneous breathing and respiratory function stable Cardiovascular status: stable Anesthetic complications: no     Last Vitals:  Vitals:   12/01/17 1026 12/01/17 1034  BP: 102/70 100/71  Pulse: 62 (!) 58  Resp: 17 19  Temp:    SpO2: 98% 100%    Last Pain:  Vitals:   12/01/17 1034  TempSrc:   PainSc: 0-No pain                 KEPHART,WILLIAM K

## 2017-12-02 ENCOUNTER — Encounter: Payer: Self-pay | Admitting: Gastroenterology

## 2017-12-05 LAB — SURGICAL PATHOLOGY

## 2017-12-12 ENCOUNTER — Encounter: Payer: Self-pay | Admitting: Gastroenterology

## 2017-12-13 ENCOUNTER — Ambulatory Visit: Payer: BLUE CROSS/BLUE SHIELD | Admitting: Physical Therapy

## 2018-01-01 ENCOUNTER — Emergency Department
Admission: EM | Admit: 2018-01-01 | Discharge: 2018-01-01 | Disposition: A | Discharge status: Home / Self Care | Payer: BLUE CROSS/BLUE SHIELD | Attending: Emergency Medicine | Admitting: Emergency Medicine

## 2018-01-01 ENCOUNTER — Other Ambulatory Visit: Payer: Self-pay

## 2018-01-01 ENCOUNTER — Emergency Department: Payer: BLUE CROSS/BLUE SHIELD

## 2018-01-01 DIAGNOSIS — Y998 Other external cause status: Secondary | ICD-10-CM | POA: Insufficient documentation

## 2018-01-01 DIAGNOSIS — W1789XA Other fall from one level to another, initial encounter: Secondary | ICD-10-CM | POA: Insufficient documentation

## 2018-01-01 DIAGNOSIS — Y9389 Activity, other specified: Secondary | ICD-10-CM | POA: Diagnosis not present

## 2018-01-01 DIAGNOSIS — S0990XA Unspecified injury of head, initial encounter: Secondary | ICD-10-CM | POA: Diagnosis present

## 2018-01-01 DIAGNOSIS — Z79899 Other long term (current) drug therapy: Secondary | ICD-10-CM | POA: Insufficient documentation

## 2018-01-01 DIAGNOSIS — M7918 Myalgia, other site: Secondary | ICD-10-CM

## 2018-01-01 DIAGNOSIS — M533 Sacrococcygeal disorders, not elsewhere classified: Secondary | ICD-10-CM | POA: Insufficient documentation

## 2018-01-01 DIAGNOSIS — M542 Cervicalgia: Secondary | ICD-10-CM | POA: Insufficient documentation

## 2018-01-01 DIAGNOSIS — W19XXXA Unspecified fall, initial encounter: Secondary | ICD-10-CM

## 2018-01-01 DIAGNOSIS — Y929 Unspecified place or not applicable: Secondary | ICD-10-CM | POA: Insufficient documentation

## 2018-01-01 DIAGNOSIS — R42 Dizziness and giddiness: Secondary | ICD-10-CM | POA: Diagnosis not present

## 2018-01-01 DIAGNOSIS — Z7901 Long term (current) use of anticoagulants: Secondary | ICD-10-CM | POA: Insufficient documentation

## 2018-01-01 IMAGING — CT CT CERVICAL SPINE W/O CM
5 of 8 series · 13 of 33 positions shown, 14 images · non-contrast
Comparison: None.

CLINICAL DATA: The patient fell off a kitchen counter today while
trying to repair blinds. Headache and neck pain. Initial encounter.

EXAM:
CT HEAD WITHOUT CONTRAST
CT CERVICAL SPINE WITHOUT CONTRAST
TECHNIQUE: Multidetector CT imaging of the head and cervical spine was
performed following the standard protocol without intravenous
contrast. Multiplanar CT image reconstructions of the cervical spine
were also generated.

[Series 3: head bone · axial · 0.47mm/px · z∈[-89,-37]mm · 2 of 79 slices shown]
[im 27/79  bone]
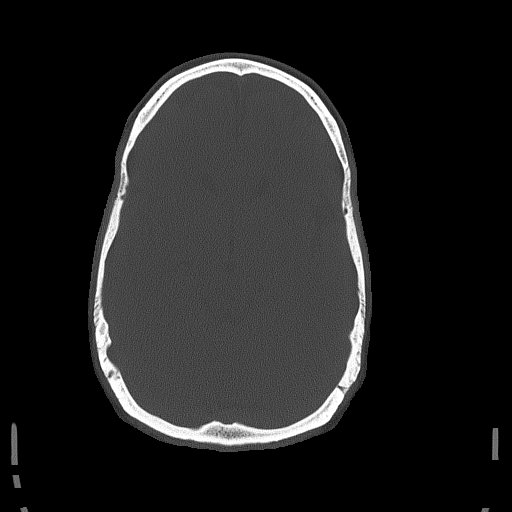
[im 53/79  bone]
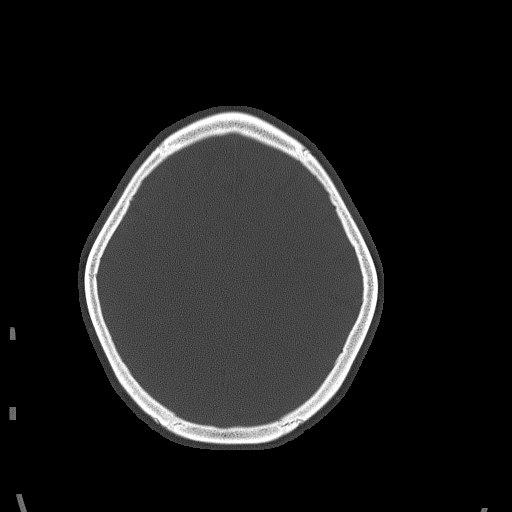

[Series 4: coronal soft tissue · coronal · 0.29mm/px · 2 of 67 slices shown]
[im 23/67  bone]
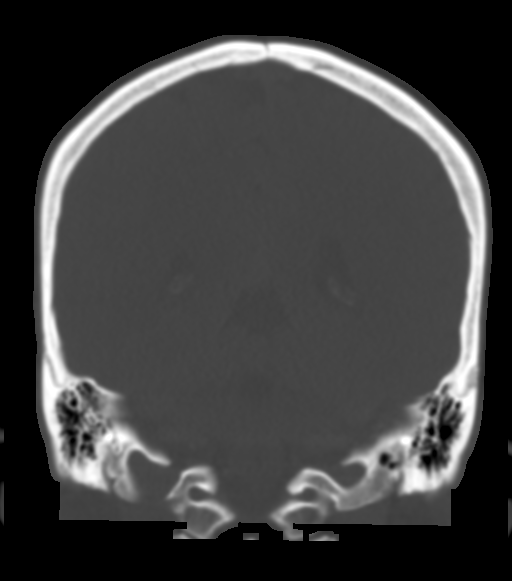
[im 45/67  bone]
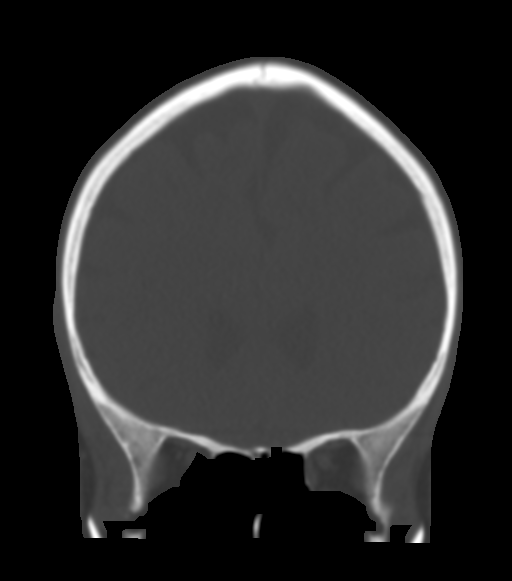

[Series 7: c spine soft · axial · 0.32mm/px · z∈[-238,-184]mm · 2 of 81 slices shown]
[im 27/81  soft-tissue]
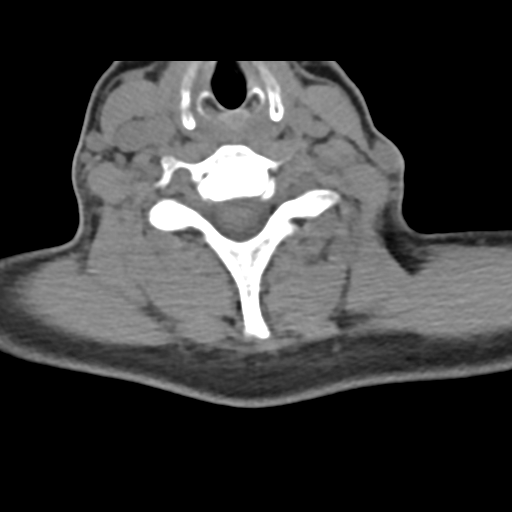
[im 54/81  soft-tissue]
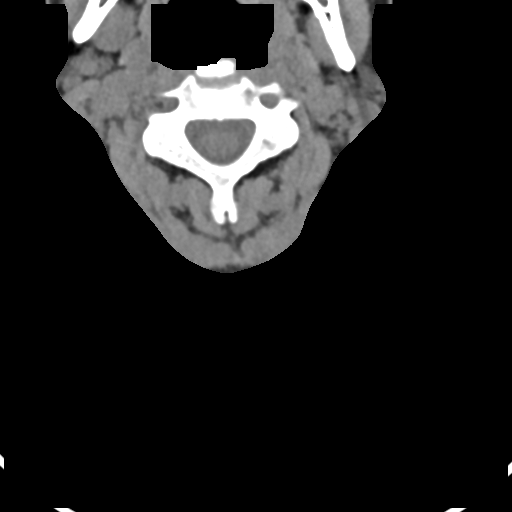

[Series 10: sagittal bone · sagittal · 0.23mm/px · 4 of 47 slices shown]
[im 10/47  bone]
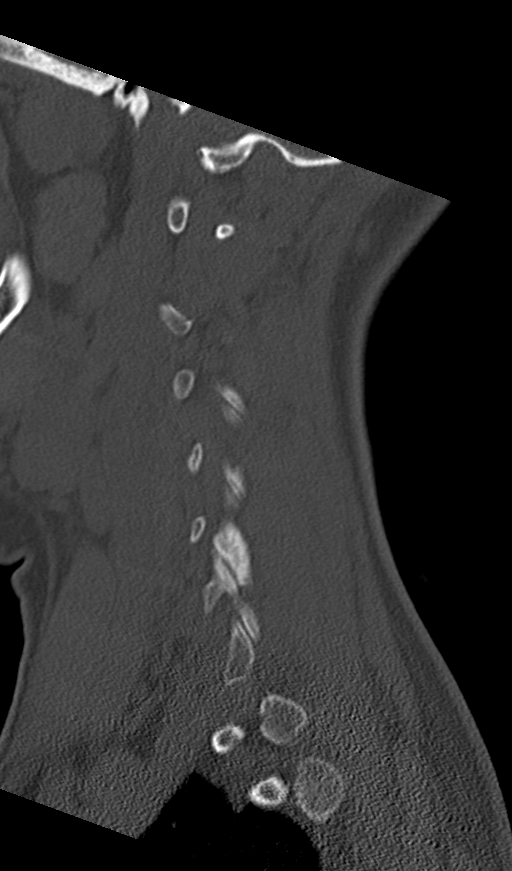
[im 19/47  bone]
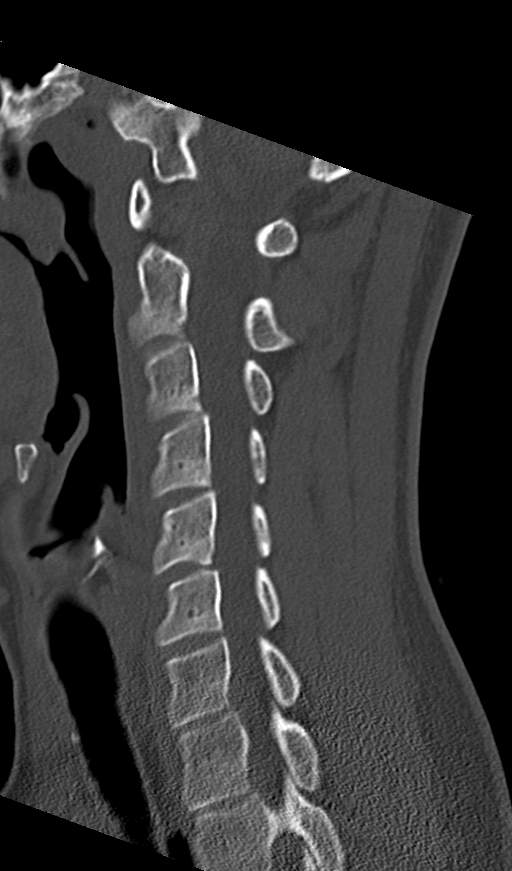
[im 28/47  bone]
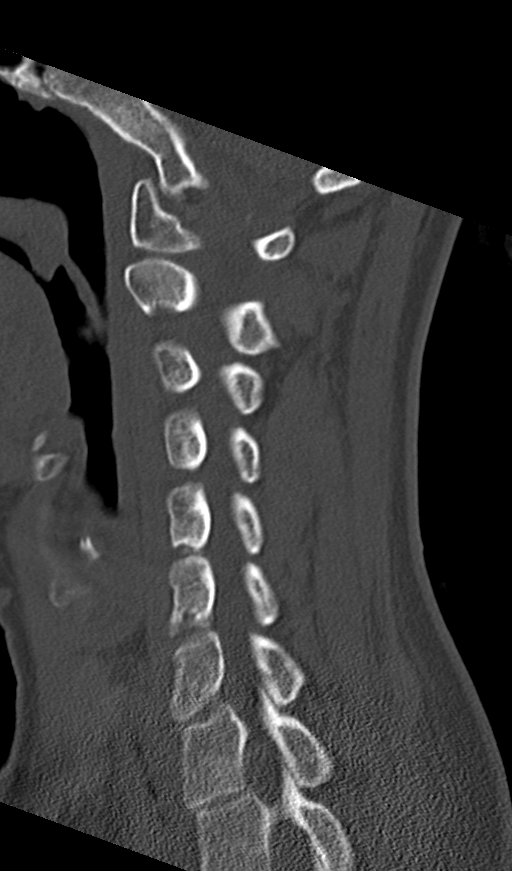
[im 37/47  bone]
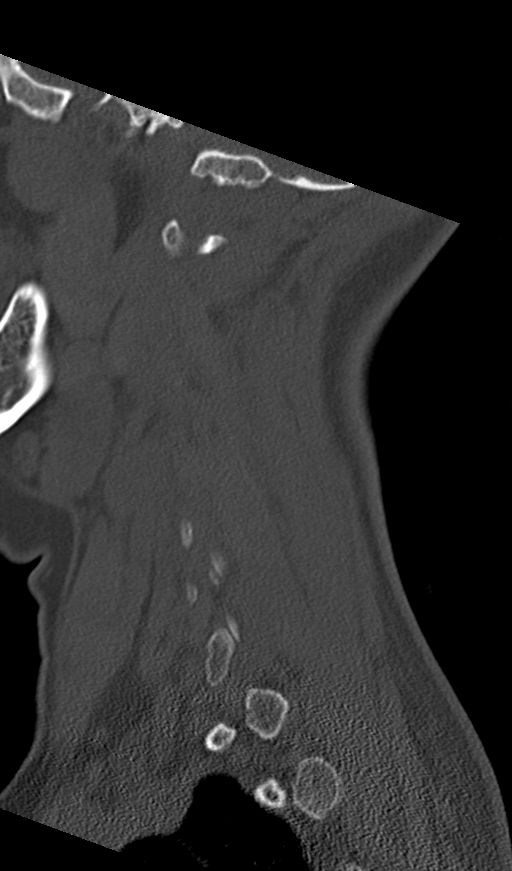

[Series 12: orthogonal bone · axial · 0.22mm/px · z∈[-278,-193]mm · 3 of 97 slices shown, 4 images]
[im 25/97  soft-tissue]
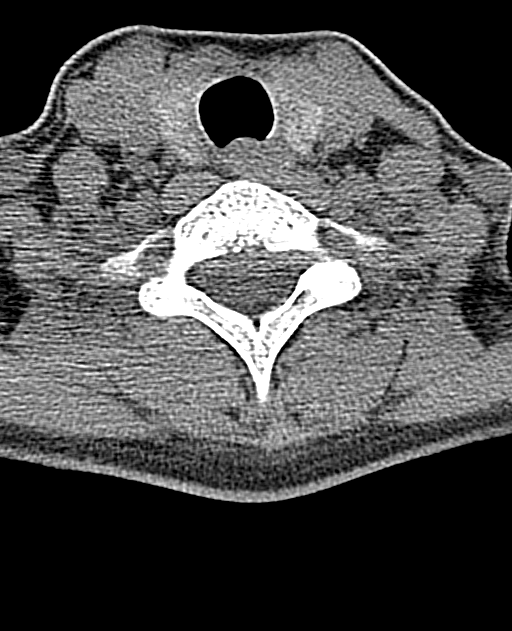
[im 25/97  bone]
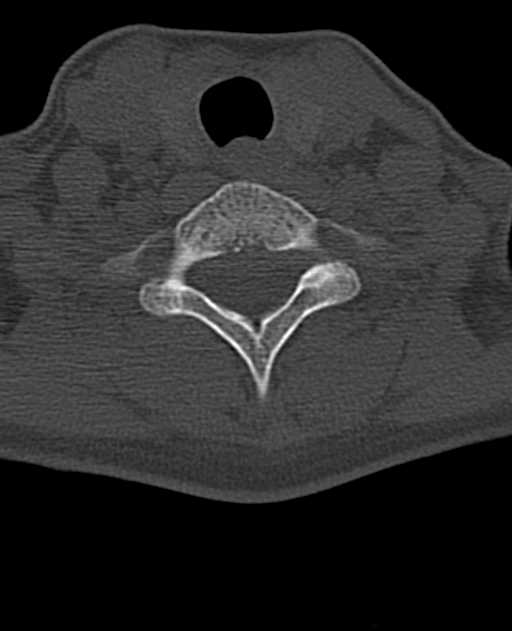
[im 49/97  bone]
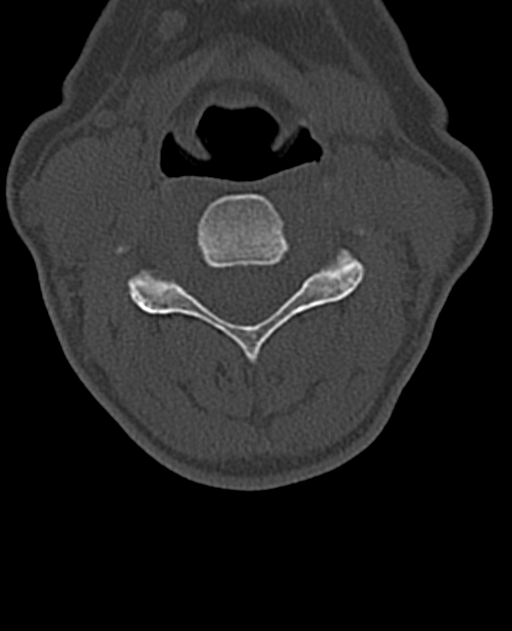
[im 73/97  bone]
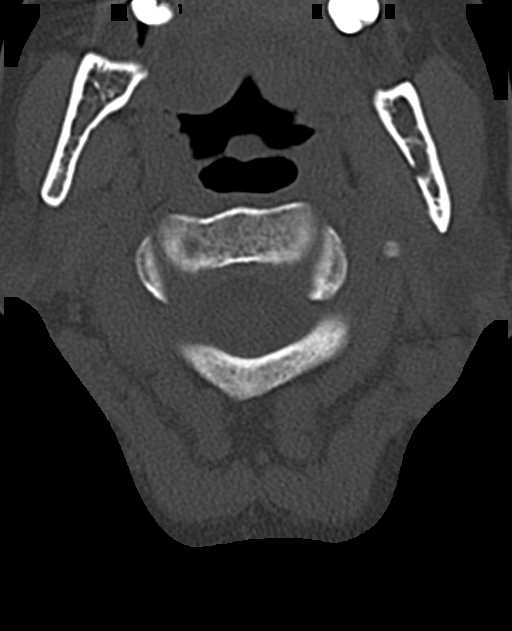

[13 of 33 positions shown; findings below may reference images not displayed]

FINDINGS: CT HEAD FINDINGS

Brain: No evidence of acute infarction, hemorrhage, hydrocephalus,
extra-axial collection or mass lesion/mass effect.

Vascular: No hyperdense vessel or unexpected calcification.

Skull: Normal. Negative for fracture or focal lesion.

Sinuses/Orbits: Negative.

Other: None.

CT CERVICAL SPINE FINDINGS

Alignment: Normal.

Skull base and vertebrae: No acute fracture. No primary bone lesion
or focal pathologic process.

Soft tissues and spinal canal: No prevertebral fluid or swelling. No
visible canal hematoma.

Disc levels:  Intervertebral disc space height is maintained.

Upper chest: Minimal scar right apex is seen.

Other: None.
IMPRESSION: Negative head and cervical spine CT scans.

## 2018-01-01 NOTE — Discharge Instructions (Signed)

## 2018-01-01 NOTE — ED Notes (Signed)
Spoke with Dr. Karma Greaser regarding patient's symptoms and findings in neuro exam.  VO given for CT head and C-spine at this time.

## 2018-01-01 NOTE — ED Triage Notes (Signed)
Pt states she was swatting on the kitchen counter trying to fix some blinds and fell back . Pt c/o back, neck, tailbone and head pain, not sure if she LOC. C/o dizziness and fuzzyheaded feeling. Pt is a/ox4 on arrival

## 2018-01-01 NOTE — ED Provider Notes (Signed)
[facility] Emergency Department Provider Note  ____________________________________________   First MD Initiated Contact with Patient 01/01/18 1801     (approximate)  I have reviewed the triage vital signs and the nursing notes.   HISTORY  Chief Complaint Fall    HPI Diana Daniels is a 42 y.o. female with extensive chronic medical history that includes fibromyalgia and chronic pain syndromes who presents for evaluation of generalized pain all over her body but specifically in her head and neck and tailbone.  She reports that she fell off of the sink on which she was standing to adjust her blinds.  This occurred yesterday around noon.  She reports that she fell backwards and struck the floor of the kitchen on her tailbone, caught herself on both outstretched hands, and struck the back of her head on the floor.  She is uncertain if she lost consciousness.  She reports that she has had increased pain gradually over the last 24 hours and is also been feeling a little bit "fuzzy headed" and dizzy.  She reports generalized aching pain all of her body that is worse than her usual fibromyalgia.  Nothing in particular makes her pain better or worse.  She is ambulatory without any difficulty.  She is able to use her wrist and her hands but she reports that it is painful to touch them or move them.  She describes the pain is severe.  She denies any recent illnesses, and specifically denies fever/chills, chest pain or shortness of breath, vomiting, and abdominal pain.  She has had some nausea.  Past Medical History:  Diagnosis Date  . Anxiety   . Attention deficit hyperactivity disorder (ADHD)   . Celiac disease   . Complication of anesthesia    Propofol intolerance  . Depression   . Factor 5 Leiden mutation, heterozygous ([place])   . Fibromyalgia   . Hypoglycemia   . Raynaud's disease     Patient Active Problem List   Diagnosis Date Noted  . Neutropenia ([place])  11/10/2017  . Positive ANA (antinuclear antibody) 11/09/2017  . Connective tissue disease ([place]) 10/18/2017  . Fibromyalgia 10/18/2017  . Loss of height 10/18/2017  . Celiac disease 03/02/2016  . Chronic back pain 03/02/2016  . Leukopenia 03/02/2016  . Chronic fatigue 03/02/2016  . Major depression 02/04/2016  . Generalized anxiety disorder 03/07/2015  . Esophageal dysphagia 08/30/2013  . Weight loss 08/30/2013  . Homozygous Factor V Leiden mutation ([place]) 01/21/2012    Past Surgical History:  Procedure Laterality Date  . COLONOSCOPY N/A 12/01/2017   Procedure: COLONOSCOPY;  Surgeon: Jonathon Bellows, MD;  Location: [facility] ENDOSCOPY;  Service: Gastroenterology;  Laterality: N/A;  NO PROPOFOL  . ESOPHAGOGASTRODUODENOSCOPY N/A 12/01/2017   Procedure: ESOPHAGOGASTRODUODENOSCOPY (EGD);  Surgeon: Jonathon Bellows, MD;  Location[facility] ENDOSCOPY;  Service: Gastroenterology;  Laterality: N/A;  NO PROPOFOL.  Marland Kitchen FOOT SURGERY      Prior to Admission medications   Medication Sig Start Date End Date Taking? Authorizing Provider  metaxalone (SKELAXIN) 800 MG tablet Take 400 mg by mouth 3 (three) times daily.   Yes [provider]  amphetamine-dextroamphetamine (ADDERALL) 20 MG tablet Take 1 tablet (20 mg total) by mouth 2 (two) times daily. 03/02/16   [name], NP  buPROPion (WELLBUTRIN) 100 MG tablet Take 100 mg by mouth daily.    [provider]  calcium-vitamin D (OSCAL WITH D) 500-200 MG-UNIT tablet Take 2 tablets by mouth.    [provider]  clonazePAM [place])  1 MG tablet Take 1 tablet (1 mg total) by mouth as needed for anxiety. 03/02/16   [name], NP  Efinaconazole 10 % SOLN Apply 1 application topically daily. 07/28/17   Volney American, PA-C  FLUoxetine (PROZAC) 10 MG capsule  07/28/17   [provider]  FLUoxetine (PROZAC) 20 MG capsule Take 20 mg by mouth daily.    [provider]  Multiple Vitamin (MULTIVITAMIN) capsule Take 1 capsule by  mouth daily.    [provider]  omeprazole (PRILOSEC) 40 MG capsule Take 1 capsule (40 mg total) by mouth daily. 10/26/17   Jonathon Bellows, MD  rivaroxaban (XARELTO) 20 MG TABS tablet Take 1 tablet (20 mg total) by mouth daily. 11/18/16   Johnson, Megan P, DO  tiZANidine (ZANAFLEX) 2 MG tablet Take 1 tablet by mouth daily. 11/09/17   [provider]    Allergies Propofol  Family History  Problem Relation Age of Onset  . Clotting disorder Father   . Cancer Maternal Grandmother   . Breast cancer Maternal Grandmother   . Stroke Paternal Grandmother   . Kidney disease Paternal Grandfather     Social History Social History   Tobacco Use  . Smoking status: Never Smoker  . Smokeless tobacco: Never Used  Substance Use Topics  . Alcohol use: No  . Drug use: No    Review of Systems Constitutional: No fever/chills Eyes: No visual changes. ENT: No sore throat. Cardiovascular: Denies chest pain. Respiratory: Denies shortness of breath. Gastrointestinal: No abdominal pain.  Nausea, no vomiting.  No diarrhea.  No constipation. Genitourinary: Negative for dysuria. Musculoskeletal: Pain in neck and back, specifically lower back, as well as in both arms and legs in the back of her head. Integumentary: Negative for rash. Neurological: Negative for headaches, focal weakness or numbness.   ____________________________________________   PHYSICAL EXAM:  VITAL SIGNS: ED Triage Vitals  Enc Vitals Group     BP 01/01/18 1637 108/76     Pulse Rate 01/01/18 1637 91     Resp 01/01/18 1637 16     Temp 01/01/18 1637 98.3 F (36.8 C)     Temp Source 01/01/18 1637 Oral     SpO2 01/01/18 1637 98 %     Weight 01/01/18 1638 59 kg (130 lb)     Height 01/01/18 1638 1.753 m (5\' 9" )     Head Circumference --      Peak Flow --      Pain Score 01/01/18 1637 7     Pain Loc --      Pain Edu? --      Excl. in [place]? --     Constitutional: Alert and oriented. Well appearing and in no  acute distress. Eyes: Conjunctivae are normal.  Pupils are large but equally reactive bilaterally. Head: Small hematoma on the back of her head, no laceration, no bogginess. Nose: No congestion/rhinnorhea. Mouth/Throat: Mucous membranes are moist. Neck: No stridor.  No meningeal signs.  No cervical spine tenderness to palpation.  Soft tissue tenderness of the neck. Cardiovascular: Normal rate, regular rhythm. Good peripheral circulation. Grossly normal heart sounds. Respiratory: Normal respiratory effort.  No retractions. Lungs CTAB. Gastrointestinal: Soft and nontender. No distention.  Musculoskeletal: No lower extremity tenderness nor edema. No gross deformities of extremities.  She reports tenderness to palpation all along the full length of her forearms and hands.  She also has tenderness to palpation along the soft tissues of her back, but there is no bony tenderness to  palpation. Neurologic:  Normal speech and language. No gross focal neurologic deficits are appreciated.  Skin:  Skin is warm, dry and intact. No rash noted. Psychiatric: Mood and affect are somewhat blunted and depressed but essentially normal under the circumstances.  ____________________________________________   LABS (all labs ordered are listed, but only abnormal results are displayed)  Labs Reviewed - No data to display ____________________________________________  EKG  None - EKG not ordered by ED physician ____________________________________________  RADIOLOGY  ED MD interpretation: No evidence of acute injury or abnormality on CT head and CT cervical spine  Official radiology report(s): Ct Head Wo Contrast  Result Date: 01/01/2018 CLINICAL DATA:  The patient fell off a kitchen counter today while trying to repair blinds. Headache and neck pain. Initial encounter. EXAM: CT HEAD WITHOUT CONTRAST CT CERVICAL SPINE WITHOUT CONTRAST TECHNIQUE: Multidetector CT imaging of the head and cervical spine was  performed following the standard protocol without intravenous contrast. Multiplanar CT image reconstructions of the cervical spine were also generated. COMPARISON:  None. FINDINGS: CT HEAD FINDINGS Brain: No evidence of acute infarction, hemorrhage, hydrocephalus, extra-axial collection or mass lesion/mass effect. Vascular: No hyperdense vessel or unexpected calcification. Skull: Normal. Negative for fracture or focal lesion. Sinuses/Orbits: Negative. Other: None. CT CERVICAL SPINE FINDINGS Alignment: Normal. Skull base and vertebrae: No acute fracture. No primary bone lesion or focal pathologic process. Soft tissues and spinal canal: No prevertebral fluid or swelling. No visible canal hematoma. Disc levels:  Intervertebral disc space height is maintained. Upper chest: Minimal scar right apex is seen. Other: None. IMPRESSION: Negative head and cervical spine CT scans. Electronically Signed   By: Inge Rise M.D.   On: 01/01/2018 17:42   Ct Cervical Spine Wo Contrast  Result Date: 01/01/2018 CLINICAL DATA:  The patient fell off a kitchen counter today while trying to repair blinds. Headache and neck pain. Initial encounter. EXAM: CT HEAD WITHOUT CONTRAST CT CERVICAL SPINE WITHOUT CONTRAST TECHNIQUE: Multidetector CT imaging of the head and cervical spine was performed following the standard protocol without intravenous contrast. Multiplanar CT image reconstructions of the cervical spine were also generated. COMPARISON:  None. FINDINGS: CT HEAD FINDINGS Brain: No evidence of acute infarction, hemorrhage, hydrocephalus, extra-axial collection or mass lesion/mass effect. Vascular: No hyperdense vessel or unexpected calcification. Skull: Normal. Negative for fracture or focal lesion. Sinuses/Orbits: Negative. Other: None. CT CERVICAL SPINE FINDINGS Alignment: Normal. Skull base and vertebrae: No acute fracture. No primary bone lesion or focal pathologic process. Soft tissues and spinal canal: No prevertebral  fluid or swelling. No visible canal hematoma. Disc levels:  Intervertebral disc space height is maintained. Upper chest: Minimal scar right apex is seen. Other: None. IMPRESSION: Negative head and cervical spine CT scans. Electronically Signed   By: Inge Rise M.D.   On: 01/01/2018 17:42    ____________________________________________   PROCEDURES  Critical Care performed: No   Procedure(s) performed:   Procedures   ____________________________________________   INITIAL IMPRESSION / ASSESSMENT AND PLAN / ED COURSE  As part of my medical decision making, I reviewed the following data within the [place] History obtained from family, Nursing notes reviewed and incorporated, Old chart reviewed and Notes from prior ED visits    Differential diagnosis includes, but is not limited to, intracranial bleed such as a subdural or traumatic subarachnoid hemorrhage, concussion, postconcussive syndrome, musculoskeletal strain/sprain.  We obtain CT head and CT cervical spine which were both reassuring.  The patient's vital signs been normal and stable and in spite  of the pain that she reports, I observed her ambulating around the emergency department into it from the bathroom without any apparent difficulty.  She has full use of her arms and hands even though she reports tenderness to palpation and with flexion extension of her wrists.  I explained that with her chronic pain syndrome she was likely to be very shaken up and sore likely 4 days or even a week or more, but there is no evidence of any acute injury.  I did discuss and offer to obtain lumbar spine x-rays and x-rays of the sacrum and coccyx, but we all collectively decided that it was not necessary given that she has no bony tenderness to palpation.  She will follow-up as an outpatient with her primary care provider.  At her request, I will also provide follow-up information with Dr. Holley Raring with the [facility] pain clinic.  I  gave my usual customary return precautions     ____________________________________________  FINAL CLINICAL IMPRESSION(S) / ED DIAGNOSES  Final diagnoses:  Fall, initial encounter  Minor head injury, initial encounter  Musculoskeletal pain     MEDICATIONS GIVEN DURING THIS VISIT:  Medications - No data to display   ED Discharge Orders    None       Note:  This document was prepared using Dragon voice recognition software and may include unintentional dictation errors.    Hinda Kehr, MD 01/01/18 1943

## 2018-01-25 ENCOUNTER — Ambulatory Visit: Payer: BLUE CROSS/BLUE SHIELD | Admitting: Gastroenterology

## 2018-01-25 ENCOUNTER — Encounter: Payer: Self-pay | Admitting: Gastroenterology

## 2018-03-20 ENCOUNTER — Ambulatory Visit: Payer: Self-pay | Admitting: Unknown Physician Specialty

## 2018-03-20 NOTE — Telephone Encounter (Signed)
She called in c/o both arms, hands, feet and face that has been occurring intermittently since at least June and now is constant.   See triage notes.   These are not new issues.   She mentioned she fell in June and was seen in the ED.   "I've lived with chronic pain for decades".   "I have fibromyalgia and have been tested for all kinds of immune disorders".    I made her an appt with Carles Collet, PA-C since [name] is no longer full time with the practice.   Appt is for 03/21/18 at 2:00.        Reason for Disposition . [1] Numbness or tingling in one or both hands AND [2] is a chronic symptom (recurrent or ongoing AND present > 4 weeks)  Answer Assessment - Initial Assessment Questions 1. SYMPTOM: "What is the main symptom you are concerned about?" (e.g., weakness, numbness)     Numbness on both arms, face, neck and hot tinkling feeling in my hands and feet.   Things are falling out of my hands.    I fell in June.   I went to ED.  I've been tested for a lot of autoimmune diseases. 2. ONSET: "When did this start?" (minutes, hours, days; while sleeping)     The numbness is just getting worse.         I've had pain for decades and fibromyalgia. 3. LAST NORMAL: "When was the last time you were normal (no symptoms)?"     I've had this constant numbness a lot recently. 4. PATTERN "Does this come and go, or has it been constant since it started?"  "Is it present now?"     It was intermittent but now it's all the time. 5. CARDIAC SYMPTOMS: "Have you had any of the following symptoms: chest pain, difficulty breathing, palpitations?"     No 6. NEUROLOGIC SYMPTOMS: "Have you had any of the following symptoms: headache, dizziness, vision loss, double vision, changes in speech, unsteady on your feet?"     I get migraines.   "I've had a migraine all my life."   7. OTHER SYMPTOMS: "Do you have any other symptoms?"     "I usually ignore the pain but this is different".    I was on my iPad and now  I'm scared after reading some of the things". 8. PREGNANCY: "Is there any chance you are pregnant?" "When was your last menstrual period?"     No.    Still having regular periods.  Protocols used: NEUROLOGIC DEFICIT-A-AH

## 2018-03-21 ENCOUNTER — Encounter: Payer: Self-pay | Admitting: Physician Assistant

## 2018-03-21 ENCOUNTER — Other Ambulatory Visit: Payer: Self-pay

## 2018-03-21 ENCOUNTER — Ambulatory Visit (INDEPENDENT_AMBULATORY_CARE_PROVIDER_SITE_OTHER): Payer: BLUE CROSS/BLUE SHIELD | Admitting: Physician Assistant

## 2018-03-21 VITALS — BP 111/73 | HR 88 | Temp 98.2°F | Ht 67.0 in | Wt 137.0 lb

## 2018-03-21 DIAGNOSIS — R58 Hemorrhage, not elsewhere classified: Secondary | ICD-10-CM

## 2018-03-21 DIAGNOSIS — R29890 Loss of height: Secondary | ICD-10-CM

## 2018-03-21 DIAGNOSIS — G629 Polyneuropathy, unspecified: Secondary | ICD-10-CM

## 2018-03-21 DIAGNOSIS — R5383 Other fatigue: Secondary | ICD-10-CM

## 2018-03-21 NOTE — Progress Notes (Signed)
Subjective:    Patient ID: Diana Daniels, female    DOB: 1976/01/08, 42 y.o.   MRN: [id number]  Diana Daniels is a 42 y.o. female presenting on 03/21/2018 for Neck Pain (x 2 months/ pt states she has been having neck, shoulders, hands numbness, tingling and weakness. States sometimes she feels like she might drop things because of her arms weakness.) and Other (pt states she has a bruise on left leg and seems like have some knots in it. pt states she fell about 2 months ago and hit her back and back of head, went to the hospital.)   HPI   The pain in her neck has been going on for years. Numbness and tingling for years. All of her hands and fingers numb and tingling. Straight down her arm on posterior surface. Also reports numbness and tingling in her arms. Numbness and tingling in feet her feet as well. Also reports tingling in her face. Says her grip strength has been reduced. Denies vision changes, balance issues, urinary incontinence. She has a history of fibromyalgia. She was seen by Allene Dillon at physiatry for trigger point injections. She was seen and evaluated one time, and did not return.   She also reports she has a bruise on her leg and it feels lumpy. Has factor v leiden disorder, takes xarelto prior to flights. Has been seen by oncology.  She also reports she has lost 1.5 inches of height, she says she has mentioned this to providers previously but nobody thought anything of it.   She also reports that she heard something about chronic fatigue syndrome that if you cannot stand still while keeping your feet together then you have this disorder. She reports she cannot do this and everybody else she knows can.     Social History   Tobacco Use  . Smoking status: Never Smoker  . Smokeless tobacco: Never Used  Substance Use Topics  . Alcohol use: No  . Drug use: No    Review of Systems Per HPI unless specifically indicated above     Objective:    BP 111/73    Pulse 88   Temp 98.2 F (36.8 C) (Oral)   Ht 5\' 7"  (1.702 m)   Wt 137 lb (62.1 kg)   SpO2 99%   BMI 21.46 kg/m   Wt Readings from Last 3 Encounters:  03/21/18 137 lb (62.1 kg)  01/01/18 130 lb (59 kg)  12/01/17 125 lb (56.7 kg)    Physical Exam  Constitutional: She is oriented to person, place, and time.  Cardiovascular: Normal rate and regular rhythm.  Pulmonary/Chest: Effort normal and breath sounds normal.  Musculoskeletal: She exhibits tenderness. She exhibits no edema or deformity.  Exam difficult to perform. Try to palpate c-spine and patient evades touch, says people touching her there makes her want to throw up. Unable to perform Spurling's maneuver. Phalen's positive in right hand. Strength normal bilateral upper extremities.   Neurological: She is alert and oriented to person, place, and time. Coordination normal.  Skin: Skin is warm and dry.  There is an ecchymoses on lower right leg with some palpable lumps.   Psychiatric: She has a normal mood and affect. Her behavior is normal.   Results for orders placed or performed in visit on 03/21/18  CBC with Differential  Result Value Ref Range   WBC 2.6 (L) 3.4 - 10.8 x10E3/uL   RBC 4.06 3.77 - 5.28 x10E6/uL   Hemoglobin 13.2 11.1 -  15.9 g/dL   Hematocrit 39.6 34.0 - 46.6 %   MCV 98 (H) 79 - 97 fL   MCH 32.5 26.6 - 33.0 pg   MCHC 33.3 31.5 - 35.7 g/dL   RDW 13.2 12.3 - 15.4 %   Platelets 239 150 - 450 x10E3/uL   Neutrophils 45 Not Estab. %   Lymphs 42 Not Estab. %   Monocytes 10 Not Estab. %   Eos 2 Not Estab. %   Basos 1 Not Estab. %   Neutrophils Absolute 1.2 (L) 1.4 - 7.0 x10E3/uL   Lymphocytes Absolute 1.1 0.7 - 3.1 x10E3/uL   Monocytes Absolute 0.3 0.1 - 0.9 x10E3/uL   EOS (ABSOLUTE) 0.1 0.0 - 0.4 x10E3/uL   Basophils Absolute 0.0 0.0 - 0.2 x10E3/uL   Immature Granulocytes 0 Not Estab. %   Immature Grans (Abs) 0.0 0.0 - 0.1 x10E3/uL  B12  Result Value Ref Range   Vitamin B-12 567 232 - 1,245 pg/mL  TSH    Result Value Ref Range   TSH 2.920 0.450 - 4.500 uIU/mL  HgB A1c  Result Value Ref Range   Hgb A1c MFr Bld 4.6 (L) 4.8 - 5.6 %   Est. average glucose Bld gHb Est-mCnc 85 mg/dL      Assessment & Plan:  1. Peripheral polyneuropathy  Difficult for me to localize the origin of her symptoms with multiple complaints and inability to perform complete exam. She does have some signs and symptoms of carpal tunnel. Her symptoms do not seem to be isolated in time and space, do not migrate between body parts and so I do not suspect MS. Will have her evaluated by orthopedics.   - CBC with Differential - B12 - TSH - HgB A1c - Ambulatory referral to Orthopedics  2. Ecchymosis  Do feel some lumps in right leg, though this is not in the deep venous system. Offered her ultrasound she declines.   3. Height loss  DEXA scan on 09/2017 showed z-score of -1.3 which is expected for age range.   4. Fatigue, unspecified type  I am not sure what to make of her report of chronic fatigue syndrome. I explained that there is no specific test to diagnose this and it is a diagnosis of exclusion. Patient upset by this and walks out of the exam room.   I have spent 25 minutes with this patient, >50% of which was spent on counseling and coordination of care.    Follow up plan: Return if symptoms worsen or fail to improve.  Carles Collet, PA-C [facility] 03/23/2018, 12:17 PM

## 2018-03-22 LAB — CBC WITH DIFFERENTIAL/PLATELET
Basophils Absolute: 0 10*3/uL (ref 0.0–0.2)
Basos: 1 %
EOS (ABSOLUTE): 0.1 10*3/uL (ref 0.0–0.4)
Eos: 2 %
Hematocrit: 39.6 % (ref 34.0–46.6)
Hemoglobin: 13.2 g/dL (ref 11.1–15.9)
Immature Grans (Abs): 0 10*3/uL (ref 0.0–0.1)
Immature Granulocytes: 0 %
Lymphocytes Absolute: 1.1 10*3/uL (ref 0.7–3.1)
Lymphs: 42 %
MCH: 32.5 pg (ref 26.6–33.0)
MCHC: 33.3 g/dL (ref 31.5–35.7)
MCV: 98 fL — ABNORMAL HIGH (ref 79–97)
Monocytes Absolute: 0.3 10*3/uL (ref 0.1–0.9)
Monocytes: 10 %
Neutrophils Absolute: 1.2 10*3/uL — ABNORMAL LOW (ref 1.4–7.0)
Neutrophils: 45 %
Platelets: 239 10*3/uL (ref 150–450)
RBC: 4.06 x10E6/uL (ref 3.77–5.28)
RDW: 13.2 % (ref 12.3–15.4)
WBC: 2.6 10*3/uL — ABNORMAL LOW (ref 3.4–10.8)

## 2018-03-22 LAB — TSH: TSH: 2.92 u[IU]/mL (ref 0.450–4.500)

## 2018-03-22 LAB — VITAMIN B12: Vitamin B-12: 567 pg/mL (ref 232–1245)

## 2018-03-22 LAB — HEMOGLOBIN A1C
Est. average glucose Bld gHb Est-mCnc: 85 mg/dL
Hgb A1c MFr Bld: 4.6 % — ABNORMAL LOW (ref 4.8–5.6)

## 2018-03-23 NOTE — Patient Instructions (Signed)
Peripheral Neuropathy Peripheral neuropathy is a type of nerve damage. It affects nerves that carry signals between the spinal cord and other parts of the body. These are called peripheral nerves. With peripheral neuropathy, one nerve or a group of nerves may be damaged. What are the causes? Many things can damage peripheral nerves. For some people with peripheral neuropathy, the cause is unknown. Some causes include:  Diabetes. This is the most common cause of peripheral neuropathy.  Injury to a nerve.  Pressure or stress on a nerve that lasts a long time.  Too little vitamin B. Alcoholism can lead to this.  Infections.  Autoimmune diseases, such as multiple sclerosis and systemic lupus erythematosus.  Inherited nerve diseases.  Some medicines, such as cancer drugs.  Toxic substances, such as lead and mercury.  Too little blood flowing to the legs.  Kidney disease.  Thyroid disease.  What are the signs or symptoms? Different people have different symptoms. The symptoms you have will depend on which of your nerves is damaged. Common symptoms include:  Loss of feeling (numbness) in the feet and hands.  Tingling in the feet and hands.  Pain that burns.  Very sensitive skin.  Weakness.  Not being able to move a part of the body (paralysis).  Muscle twitching.  Clumsiness or poor coordination.  Loss of balance.  Not being able to control your bladder.  Feeling dizzy.  Sexual problems.  How is this diagnosed? Peripheral neuropathy is a symptom, not a disease. Finding the cause of peripheral neuropathy can be hard. To figure that out, your health care provider will take a medical history and do a physical exam. A neurological exam will also be done. This involves checking things affected by your brain, spinal cord, and nerves (nervous system). For example, your health care provider will check your reflexes, how you move, and what you can feel. Other types of tests  may also be ordered, such as:  Blood tests.  A test of the fluid in your spinal cord.  Imaging tests, such as CT scans or an MRI.  Electromyography (EMG). This test checks the nerves that control muscles.  Nerve conduction velocity tests. These tests check how fast messages pass through your nerves.  Nerve biopsy. A small piece of nerve is removed. It is then checked under a microscope.  How is this treated?  Medicine is often used to treat peripheral neuropathy. Medicines may include: ? Pain-relieving medicines. Prescription or over-the-counter medicine may be suggested. ? Antiseizure medicine. This may be used for pain. ? Antidepressants. These also may help ease pain from neuropathy. ? Lidocaine. This is a numbing medicine. You might wear a patch or be given a shot. ? Mexiletine. This medicine is typically used to help control irregular heart rhythms.  Surgery. Surgery may be needed to relieve pressure on a nerve or to destroy a nerve that is causing pain.  Physical therapy to help movement.  Assistive devices to help movement. Follow these instructions at home:  Only take over-the-counter or prescription medicines as directed by your health care provider. Follow the instructions carefully for any given medicines. Do not take any other medicines without first getting approval from your health care provider.  If you have diabetes, work closely with your health care provider to keep your blood sugar under control.  If you have numbness in your feet: ? Check every day for signs of injury or infection. Watch for redness, warmth, and swelling. ? Wear padded socks and comfortable   shoes. These help protect your feet.  Do not do things that put pressure on your damaged nerve.  Do not smoke. Smoking keeps blood from getting to damaged nerves.  Avoid or limit alcohol. Too much alcohol can cause a lack of B vitamins. These vitamins are needed for healthy nerves.  Develop a good  support system. Coping with peripheral neuropathy can be stressful. Talk to a mental health specialist or join a support group if you are struggling.  Follow up with your health care provider as directed. Contact a health care provider if:  You have new signs or symptoms of peripheral neuropathy.  You are struggling emotionally from dealing with peripheral neuropathy.  You have a fever. Get help right away if:  You have an injury or infection that is not healing.  You feel very dizzy or begin vomiting.  You have chest pain.  You have trouble breathing. This information is not intended to replace advice given to you by your health care provider. Make sure you discuss any questions you have with your health care provider. Document Released: 07/09/2002 Document Revised: 12/25/2015 Document Reviewed: 03/26/2013 Elsevier Interactive Patient Education  2017 [employer]  

## 2018-04-12 NOTE — Telephone Encounter (Signed)
Opened in error

## 2018-04-13 ENCOUNTER — Other Ambulatory Visit: Payer: Self-pay | Admitting: Family Medicine

## 2018-04-13 DIAGNOSIS — G629 Polyneuropathy, unspecified: Secondary | ICD-10-CM

## 2018-06-21 ENCOUNTER — Ambulatory Visit: Payer: Self-pay | Admitting: Internal Medicine

## 2018-06-26 ENCOUNTER — Ambulatory Visit: Payer: Self-pay

## 2018-06-26 ENCOUNTER — Other Ambulatory Visit: Payer: Self-pay

## 2018-06-26 ENCOUNTER — Encounter: Payer: Self-pay | Admitting: Emergency Medicine

## 2018-06-26 ENCOUNTER — Ambulatory Visit (INDEPENDENT_AMBULATORY_CARE_PROVIDER_SITE_OTHER)
Admit: 2018-06-26 | Discharge: 2018-06-26 | Disposition: A | Discharge status: Home / Self Care | Payer: BLUE CROSS/BLUE SHIELD | Attending: Emergency Medicine | Admitting: Emergency Medicine

## 2018-06-26 ENCOUNTER — Ambulatory Visit
Admission: EM | Admit: 2018-06-26 | Discharge: 2018-06-26 | Disposition: A | Discharge status: Home / Self Care | Payer: BLUE CROSS/BLUE SHIELD | Attending: Emergency Medicine | Admitting: Emergency Medicine

## 2018-06-26 DIAGNOSIS — S0993XA Unspecified injury of face, initial encounter: Secondary | ICD-10-CM

## 2018-06-26 DIAGNOSIS — W19XXXA Unspecified fall, initial encounter: Secondary | ICD-10-CM

## 2018-06-26 DIAGNOSIS — W01198A Fall on same level from slipping, tripping and stumbling with subsequent striking against other object, initial encounter: Secondary | ICD-10-CM | POA: Diagnosis not present

## 2018-06-26 DIAGNOSIS — S161XXA Strain of muscle, fascia and tendon at neck level, initial encounter: Secondary | ICD-10-CM | POA: Diagnosis not present

## 2018-06-26 DIAGNOSIS — M542 Cervicalgia: Secondary | ICD-10-CM

## 2018-06-26 IMAGING — CT CT MAXILLOFACIAL W/O CM
1 series · 15 of 30 positions shown, 19 images · non-contrast
Comparison: Head and cervical spine CT [DATE].

CLINICAL DATA: 42-year-old female status post fall, maxillofacial
blunt trauma. Left eye, nose and upper dentition pain. Pain from the
left side of the head radiating into the neck.

EXAM:
CT HEAD WITHOUT CONTRAST
CT MAXILLOFACIAL WITHOUT CONTRAST
TECHNIQUE: Multidetector CT imaging of the head and maxillofacial structures
were performed using the standard protocol without intravenous
contrast. Multiplanar CT image reconstructions of the maxillofacial
structures were also generated.

[Series 3: ax soft · axial · 0.34mm/px · z∈[-219,-59]mm · 15 of 86 slices shown, 19 images]
[im 3/86  brain]
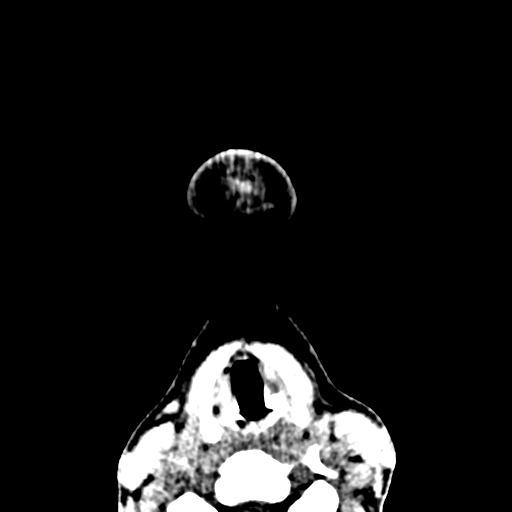
[im 3/86  bone]
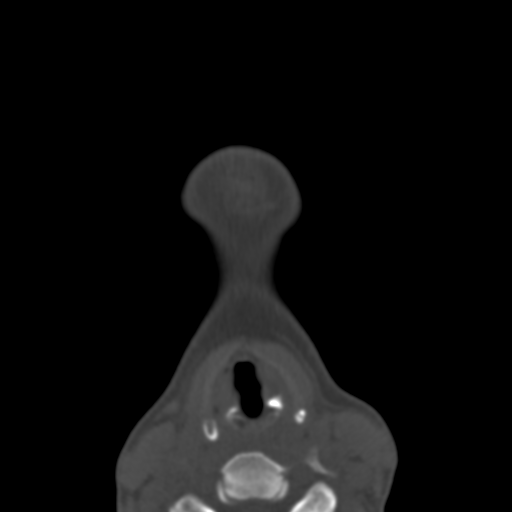
[im 9/86  bone]
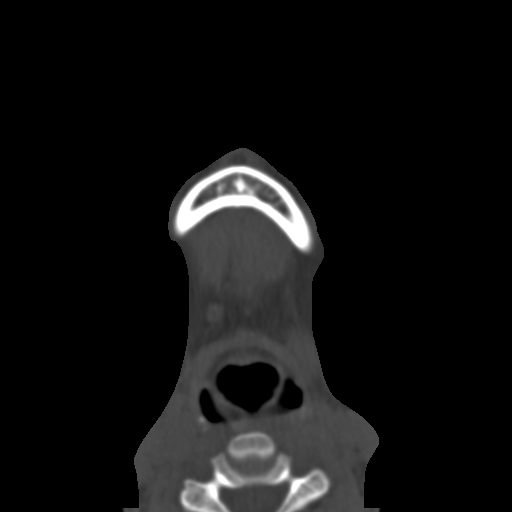
[im 15/86  bone]
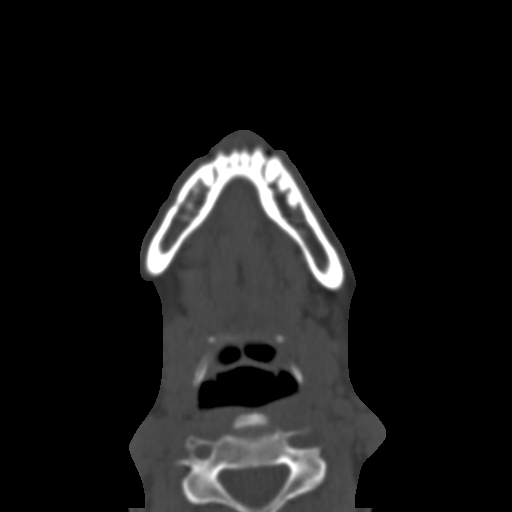
[im 21/86  bone]
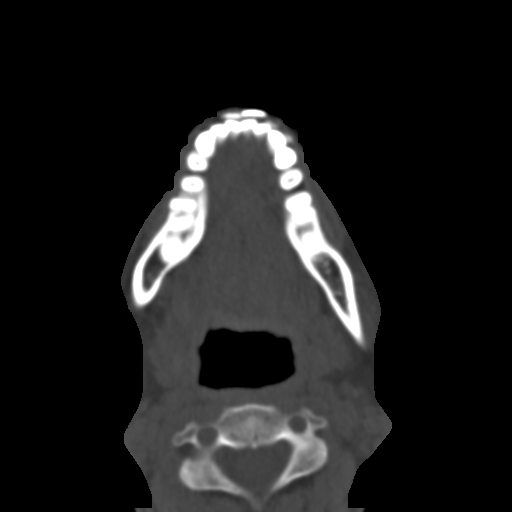
[im 27/86  brain]
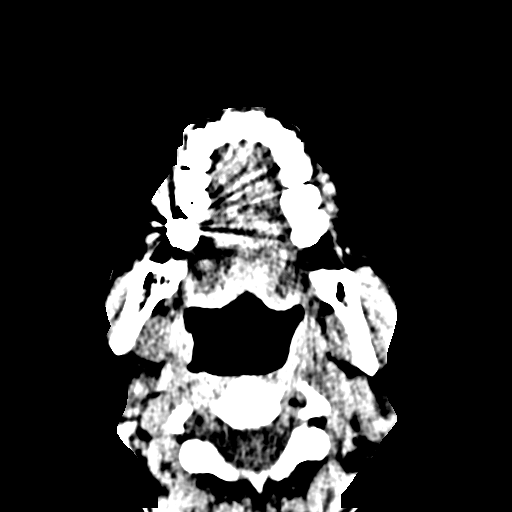
[im 27/86  bone]
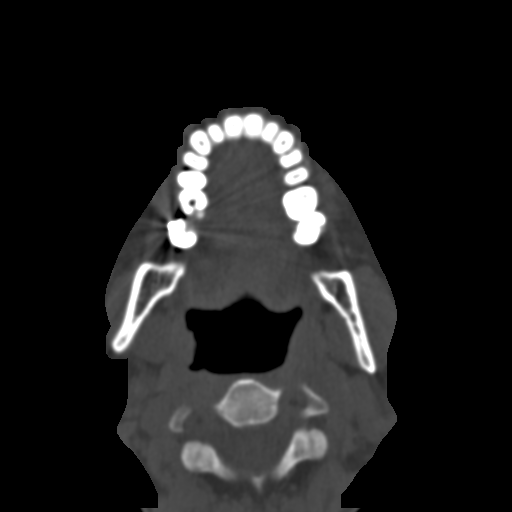
[im 33/86  bone]
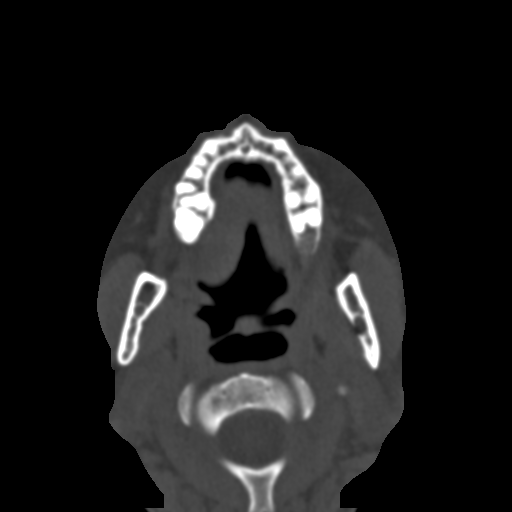
[im 39/86  bone]
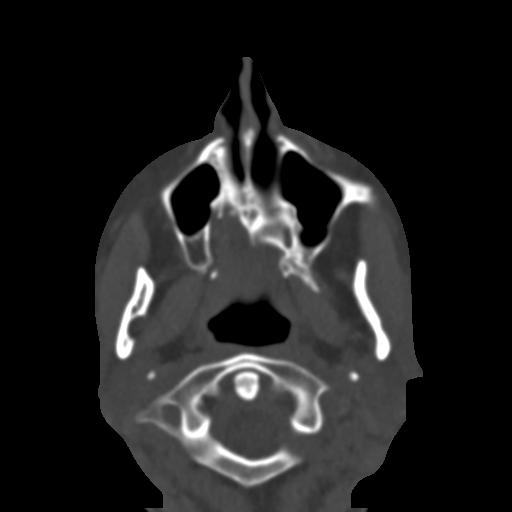
[im 44/86  bone]
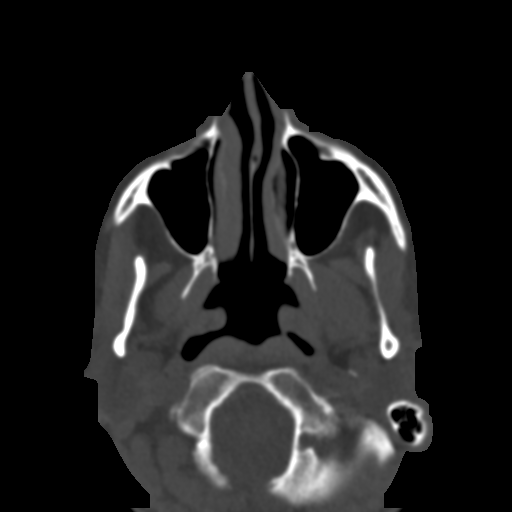
[im 47/86  brain]
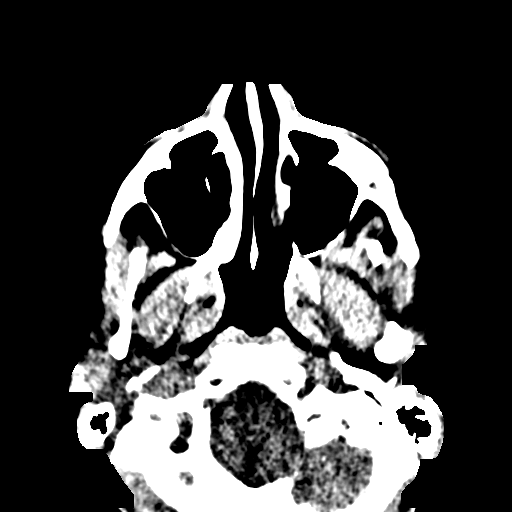
[im 47/86  bone]
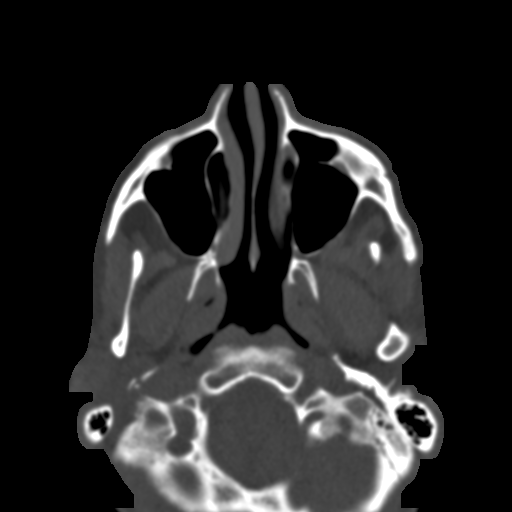
[im 53/86  bone]
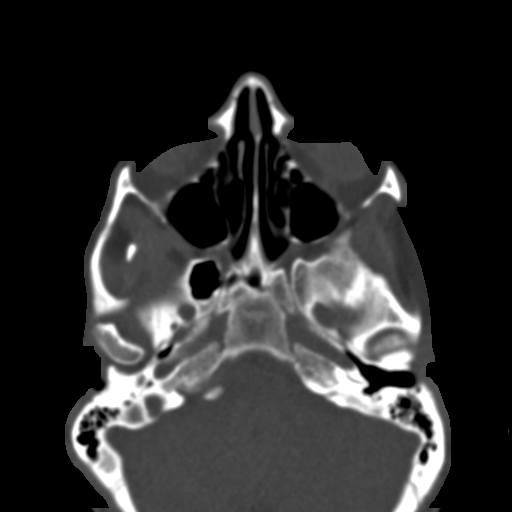
[im 59/86  bone]
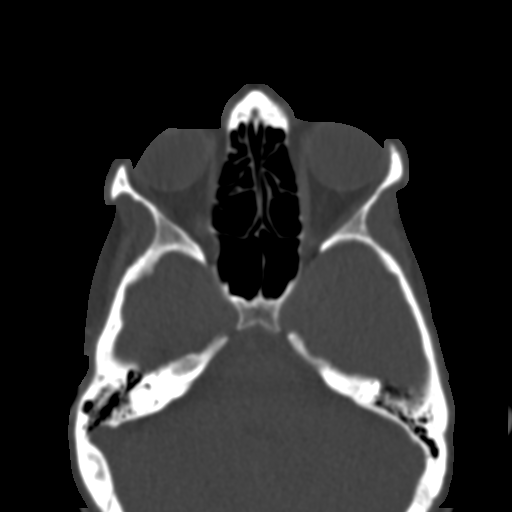
[im 65/86  bone]
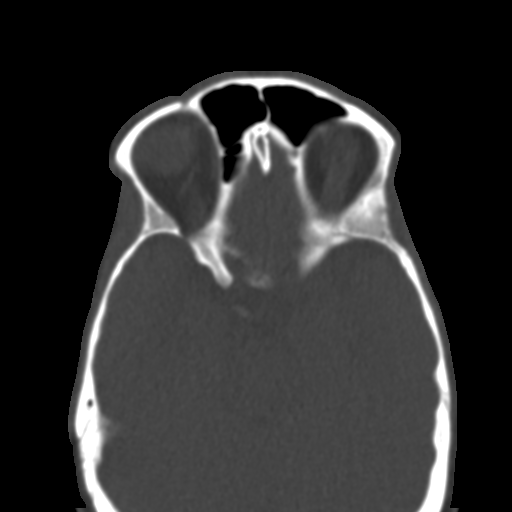
[im 71/86  brain]
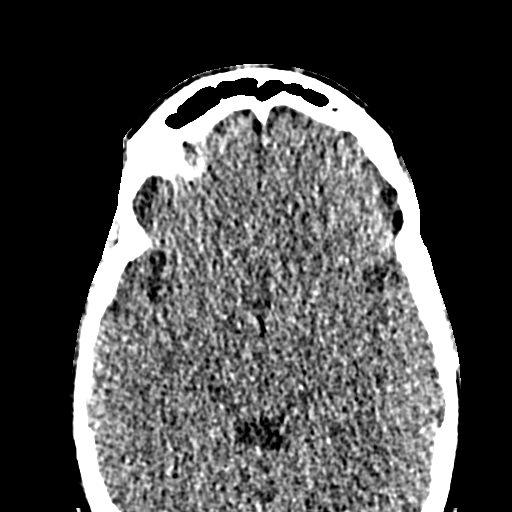
[im 71/86  bone]
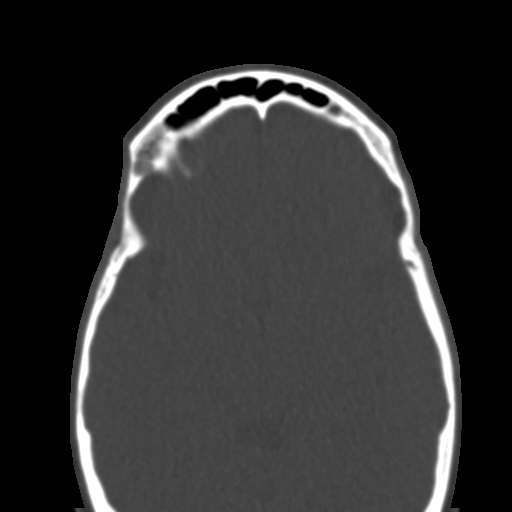
[im 77/86  bone]
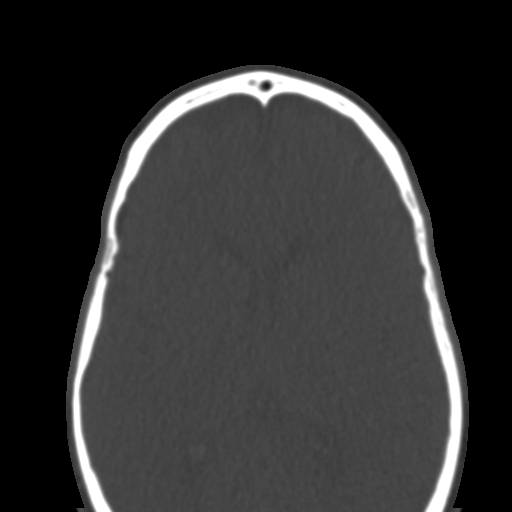
[im 83/86  bone]
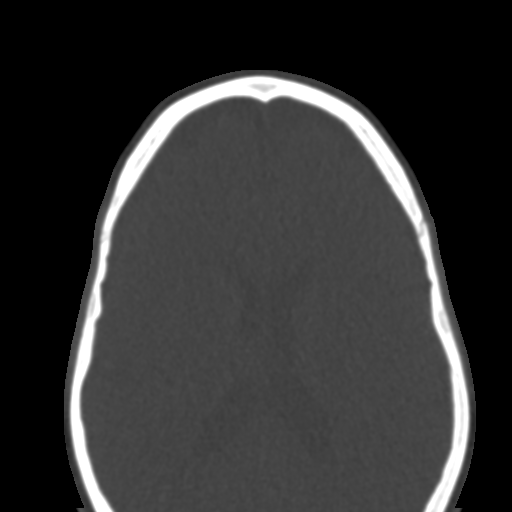

[15 of 30 positions shown; findings below may reference images not displayed]

FINDINGS: CT HEAD FINDINGS

Brain: Stable cerebral volume. No midline shift, ventriculomegaly,
mass effect, evidence of mass lesion, intracranial hemorrhage or
evidence of cortically based acute infarction. Gray-white matter
differentiation is within normal limits throughout the brain.

Vascular: No suspicious intracranial vascular hyperdensity.

Skull: Stable and intact.

Other: No scalp hematoma identified. No scalp soft tissue gas.

CT MAXILLOFACIAL FINDINGS

Osseous: Mandible intact. Maxilla and nasal bones appear intact. No
dental abnormality identified. Zygoma and central skull base intact.

Visible cervical spine appears stable and intact.

Orbits: Intact orbital walls. Symmetric and normal orbits soft
tissues.

Sinuses: Paranasal sinuses and mastoids are stable and well
pneumatized. Bilateral tympanic cavities are clear.

Soft tissues: No superficial soft tissue injury identified. Negative
visible noncontrast larynx, pharynx, parapharyngeal spaces,
retropharyngeal space, sublingual space, submandibular glands,
parotid glands, masticator spaces. Upper cervical lymph nodes are
within normal limits.
IMPRESSION: 1. No facial fracture or acute traumatic injury identified.
2. Stable and normal noncontrast CT appearance of the brain.

## 2018-06-26 IMAGING — CT CT HEAD W/O CM
1 of 3 series · 15 of 30 positions shown, 19 images · non-contrast
Comparison: Head and cervical spine CT [DATE].

CLINICAL DATA: 42-year-old female status post fall, maxillofacial
blunt trauma. Left eye, nose and upper dentition pain. Pain from the
left side of the head radiating into the neck.

EXAM:
CT HEAD WITHOUT CONTRAST
CT MAXILLOFACIAL WITHOUT CONTRAST
TECHNIQUE: Multidetector CT imaging of the head and maxillofacial structures
were performed using the standard protocol without intravenous
contrast. Multiplanar CT image reconstructions of the maxillofacial
structures were also generated.

[Series 13: head wo · axial · 0.43mm/px · z∈[-118,+14]mm · 15 of 72 slices shown, 19 images]
[im 3/72  brain]
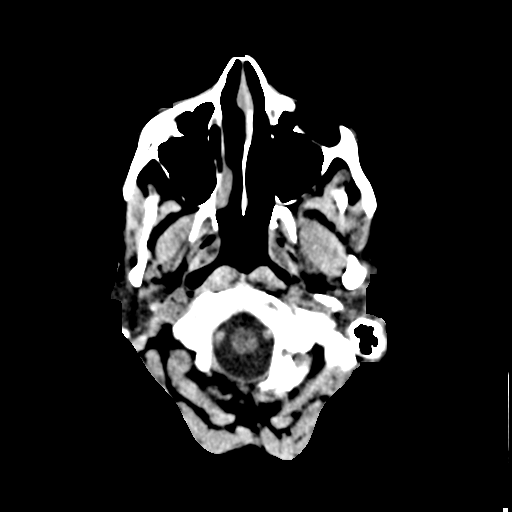
[im 3/72  bone]
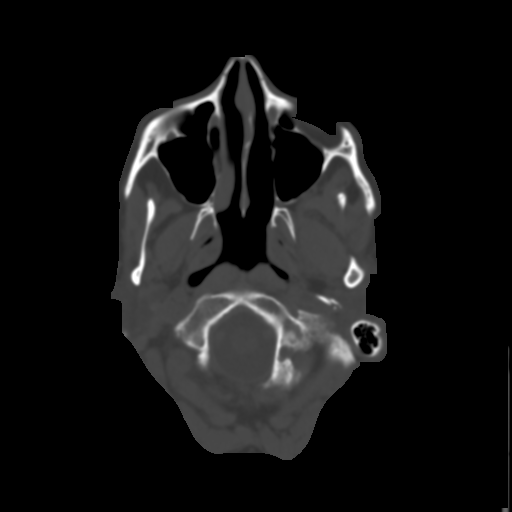
[im 9/72  brain]
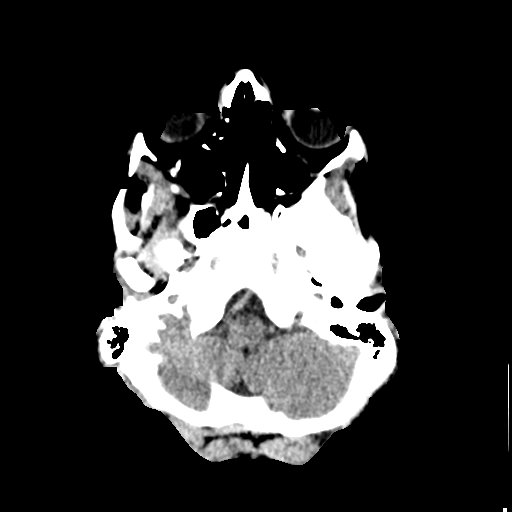
[im 12/72  brain]
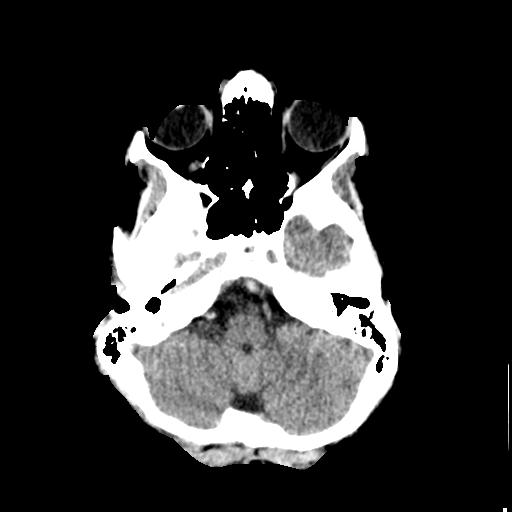
[im 18/72  brain]
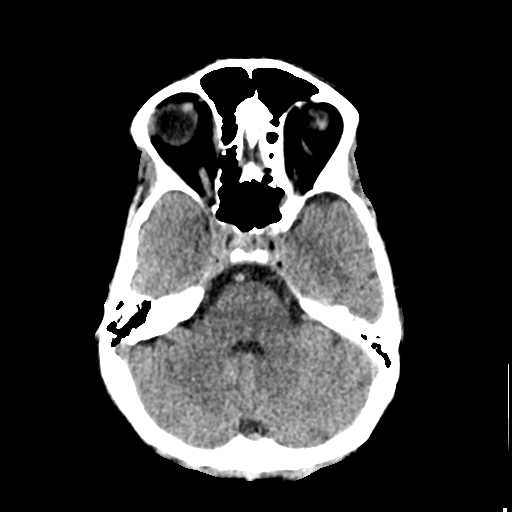
[im 21/72  brain]
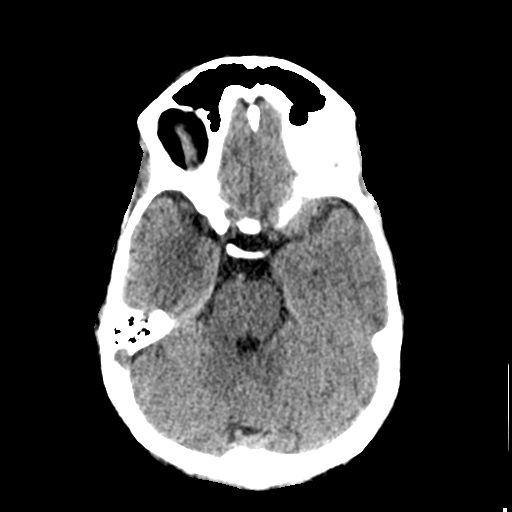
[im 21/72  bone]
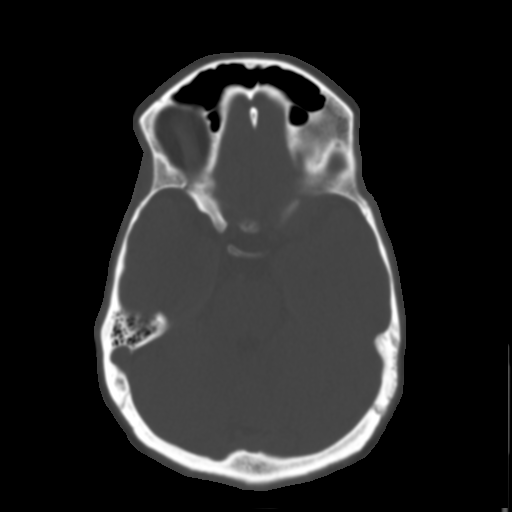
[im 27/72  brain]
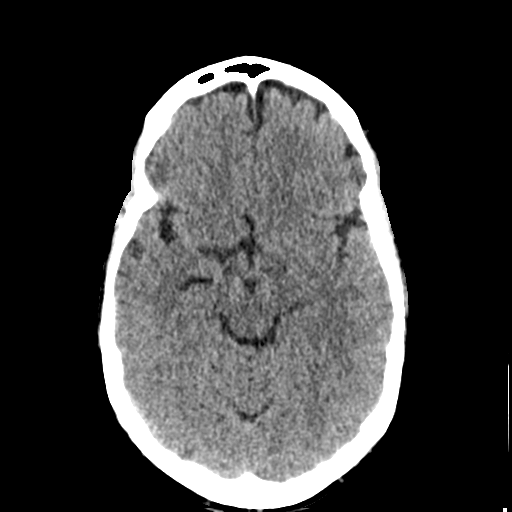
[im 30/72  brain]
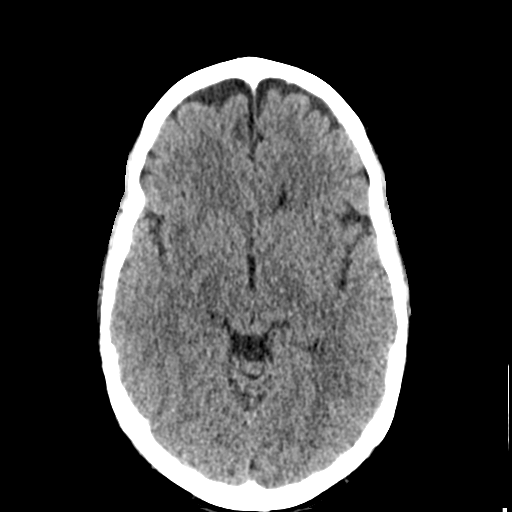
[im 36/72  brain]
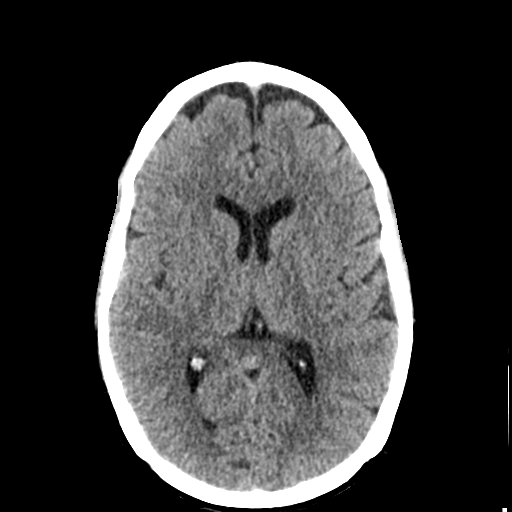
[im 42/72  brain]
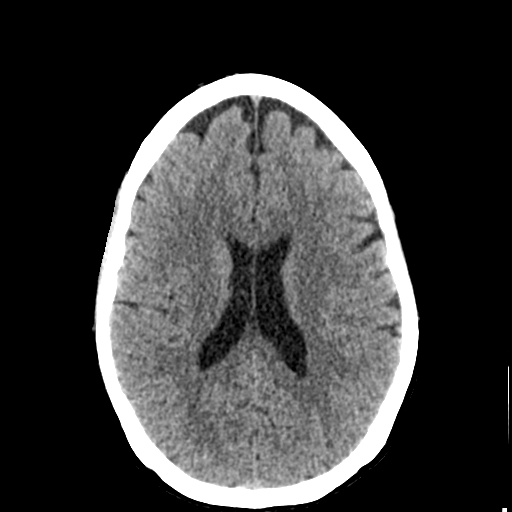
[im 42/72  bone]
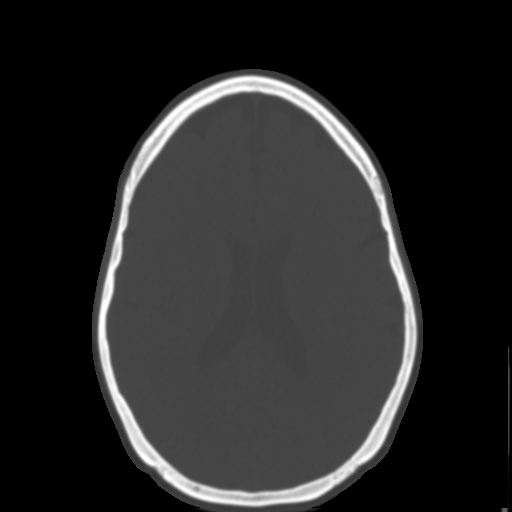
[im 45/72  brain]
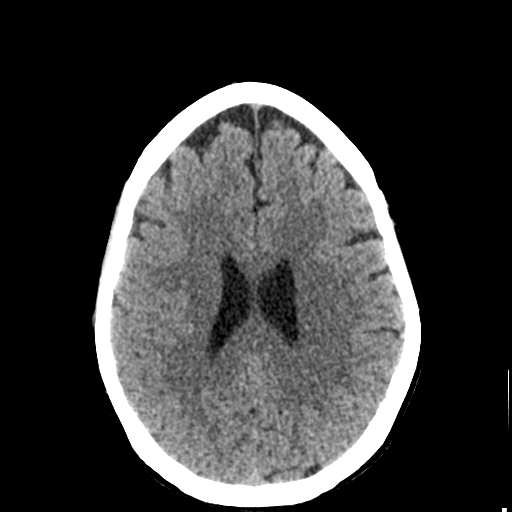
[im 51/72  brain]
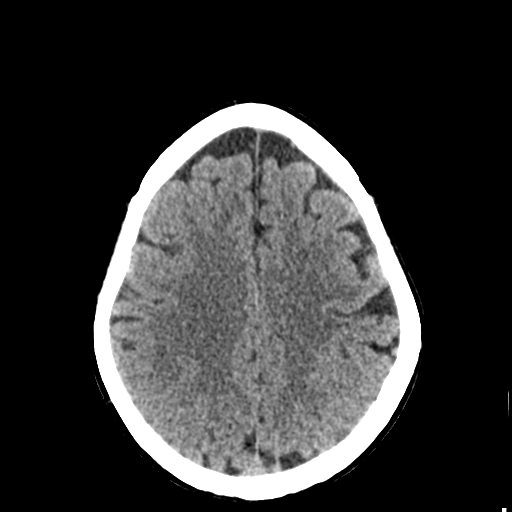
[im 54/72  brain]
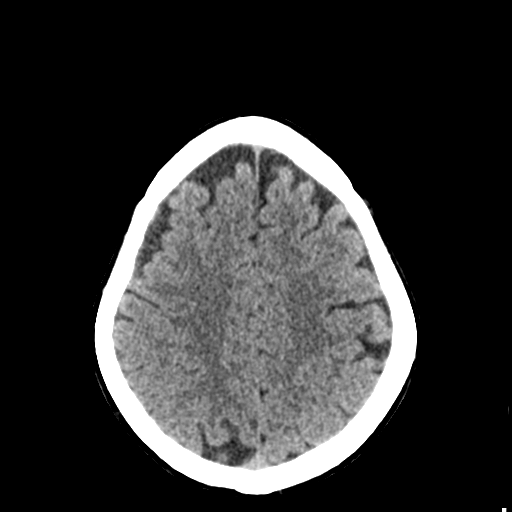
[im 60/72  brain]
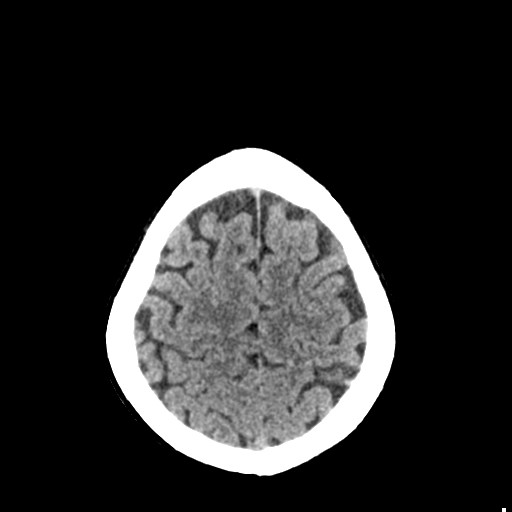
[im 60/72  bone]
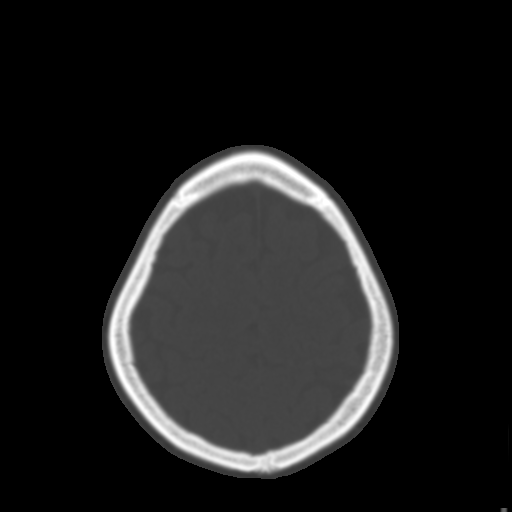
[im 63/72  brain]
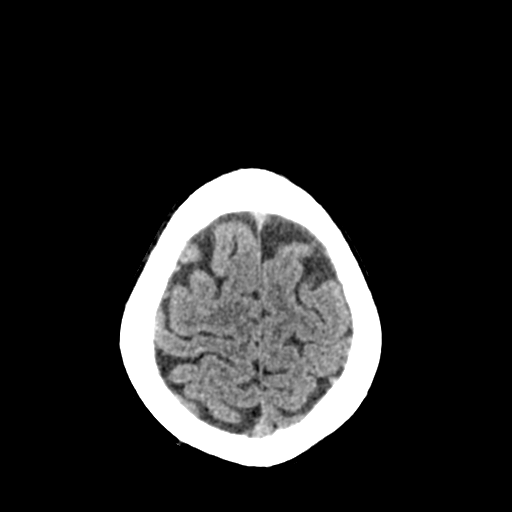
[im 69/72  brain]
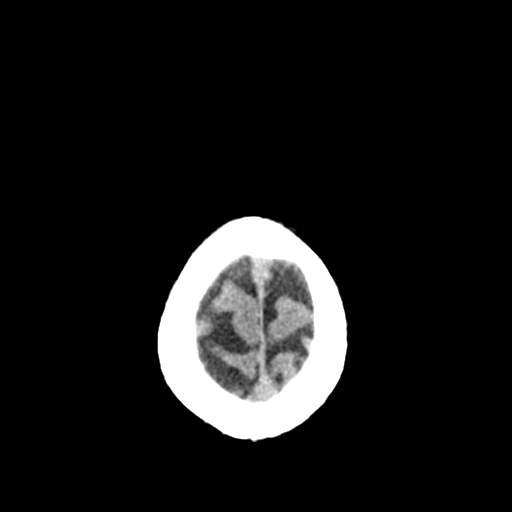

[15 of 30 positions shown; findings below may reference images not displayed]

FINDINGS: CT HEAD FINDINGS

Brain: Stable cerebral volume. No midline shift, ventriculomegaly,
mass effect, evidence of mass lesion, intracranial hemorrhage or
evidence of cortically based acute infarction. Gray-white matter
differentiation is within normal limits throughout the brain.

Vascular: No suspicious intracranial vascular hyperdensity.

Skull: Stable and intact.

Other: No scalp hematoma identified. No scalp soft tissue gas.

CT MAXILLOFACIAL FINDINGS

Osseous: Mandible intact. Maxilla and nasal bones appear intact. No
dental abnormality identified. Zygoma and central skull base intact.

Visible cervical spine appears stable and intact.

Orbits: Intact orbital walls. Symmetric and normal orbits soft
tissues.

Sinuses: Paranasal sinuses and mastoids are stable and well
pneumatized. Bilateral tympanic cavities are clear.

Soft tissues: No superficial soft tissue injury identified. Negative
visible noncontrast larynx, pharynx, parapharyngeal spaces,
retropharyngeal space, sublingual space, submandibular glands,
parotid glands, masticator spaces. Upper cervical lymph nodes are
within normal limits.
IMPRESSION: 1. No facial fracture or acute traumatic injury identified.
2. Stable and normal noncontrast CT appearance of the brain.

## 2018-06-26 MED ORDER — IBUPROFEN 600 MG PO TABS: 600.0000 mg | ORAL_TABLET | Freq: Four times a day (QID) | ORAL | 0 refills | Status: AC | PRN

## 2018-06-26 NOTE — Telephone Encounter (Signed)
Already spoke with pt and advised her to go to urgent care. Called over to [facility] but they do not do facial x-rays so suggested she go to [facility] urgent care and she agreed to go.

## 2018-06-26 NOTE — ED Provider Notes (Signed)
HPI  SUBJECTIVE:  Diana Daniels is a 42 y.o. female who presents with throbbing, constant left facial pain, epistaxis for "hours", nasal bridge swelling, periorbital pain, difficulty breathing through her nose, gingival tenderness, headache, pain with looking to the left side and photophobia after having a mechanical fall yesterday.  States that she fell forward after losing her balance, hitting her face on the rim of a trash can.  She reports worsening of her baseline neck pain more so on the left.  States it goes down her upper back.  No obvious facial deformity.  No periorbital ecchymosis.  No limitation of eye movement.  No arm or leg weakness, slurred speech, facial droop.  She tried an ice pack on her face with improvement in her symptoms.  She also tried applying some hydrogen peroxide with a Q-tip to the inside of her nose and some Tylenol.  Symptoms are worse with palpation of her face, bending forward.  She has a past medical history of factor V Leiden for which she takes Xarelto before flying.  She denies taking Xarelto recently.  She has a past medical history of fibromyalgia, carpal tunnel syndrome, DVT, chronic back pain, chronic fatigue, borderline osteopenia.  No history of diabetes, hypertension.  LMP: 2 weeks ago.  Denies the possibility of being pregnant.  PMD: Valerie Roys, DO   Past Medical History:  Diagnosis Date  . Anxiety   . Attention deficit hyperactivity disorder (ADHD)   . Celiac disease   . Complication of anesthesia    Propofol intolerance  . Depression   . Factor 5 Leiden mutation, heterozygous ([place])   . Fibromyalgia   . Hypoglycemia   . Raynaud's disease     Past Surgical History:  Procedure Laterality Date  . COLONOSCOPY N/A 12/01/2017   Procedure: COLONOSCOPY;  Surgeon: Jonathon Bellows, MD;  Location: [facility] ENDOSCOPY;  Service: Gastroenterology;  Laterality: N/A;  NO PROPOFOL  . ESOPHAGOGASTRODUODENOSCOPY N/A 12/01/2017   Procedure:  ESOPHAGOGASTRODUODENOSCOPY (EGD);  Surgeon: Jonathon Bellows, MD;  Location: [facility] ENDOSCOPY;  Service: Gastroenterology;  Laterality: N/A;  NO PROPOFOL.  Marland Kitchen FOOT SURGERY      Family History  Problem Relation Age of Onset  . Clotting disorder Father   . Cancer Maternal Grandmother   . Breast cancer Maternal Grandmother   . Stroke Paternal Grandmother   . Kidney disease Paternal Grandfather     Social History   Tobacco Use  . Smoking status: Never Smoker  . Smokeless tobacco: Never Used  Substance Use Topics  . Alcohol use: No  . Drug use: No    No current facility-administered medications for this encounter.   Current Outpatient Medications:  .  amphetamine-dextroamphetamine (ADDERALL) 20 MG tablet, Take 1 tablet (20 mg total) by mouth 2 (two) times daily., Disp: 60 tablet, Rfl: 0 .  buPROPion (WELLBUTRIN) 100 MG tablet, Take 100 mg by mouth daily., Disp: , Rfl:  .  calcium-vitamin D (OSCAL WITH D) 500-200 MG-UNIT tablet, Take 2 tablets by mouth., Disp: , Rfl:  .  clonazePAM (KLONOPIN) 1 MG tablet, Take 1 tablet (1 mg total) by mouth as needed for anxiety., Disp: 30 tablet, Rfl: 0 .  FLUoxetine (PROZAC) 10 MG tablet, Take 10 mg by mouth daily., Disp: , Rfl:  .  FLUoxetine (PROZAC) 20 MG capsule, Take 20 mg by mouth daily., Disp: , Rfl:  .  FLUoxetine (PROZAC) 40 MG capsule, , Disp: , Rfl: 0 .  Multiple Vitamin (MULTIVITAMIN) capsule, Take 1 capsule by mouth daily.,  Disp: , Rfl:  .  omeprazole (PRILOSEC) 40 MG capsule, Take 1 capsule (40 mg total) by mouth daily., Disp: 90 capsule, Rfl: 3 .  rivaroxaban (XARELTO) 20 MG TABS tablet, Take 1 tablet (20 mg total) by mouth daily., Disp: 30 tablet, Rfl: 1 .  tiZANidine (ZANAFLEX) 2 MG tablet, Take 1 tablet by mouth daily., Disp: , Rfl:  .  ibuprofen (ADVIL,MOTRIN) 600 MG tablet, Take 1 tablet (600 mg total) by mouth every 6 (six) hours as needed., Disp: 30 tablet, Rfl: 0  Allergies  Allergen Reactions  . Propofol Other (See Comments)     Nervous System malfunction     ROS  As noted in HPI.   Physical Exam  BP 100/72 (BP Location: Left Arm)   Pulse 98   Temp 98.4 F (36.9 C) (Oral)   Resp 18   Ht 5\' 9"  (1.753 m)   Wt 61.2 kg   LMP 06/12/2018   SpO2 99%   BMI 19.94 kg/m   Constitutional: Well developed, well nourished, no acute distress Eyes: PERRLA, EOMI, pain with EOMs, no direct or consensual photophobia, conjunctiva normal bilaterally.  No abrasion seen on fluorescein exam. HENT: Normocephalic, mucus membranes moist.  Negative hemotympanum.  Positive small laceration anterior left nare.  Questionable deviated septum to the left.  Positive tenderness over the nasal bridge, left periorbital rim.  No periorbital ecchymosis.  Negative raccoon eyes.  Positive tenderness over the anterior left cheek and alveolar ridge.  No alveolar ridge step-offs.  No evidence of dental trauma. Neck: Positive diffuse C-spine tenderness.  Bilateral trapezial tenderness on the left more so than the right. Positive trapezial muscle spasm left side. Respiratory: Normal inspiratory effort Cardiovascular: Normal rate GI: nondistended skin: No rash, skin intact Musculoskeletal: no deformities Neurologic: Alert & oriented x 3, no nerves III through XII intact as tested, finger-nose, heel shin within normal limits.  Tandem gait steady.  Romberg negative. Psychiatric: Speech and behavior appropriate   ED Course   Medications - No data to display  Orders Placed This Encounter  Procedures  . CT Maxillofacial Wo Contrast    Standing Status:   Standing    Number of Occurrences:   1    Order Specific Question:   Is patient pregnant?    Answer:   No  . CT Head Wo Contrast    Standing Status:   Standing    Number of Occurrences:   1    Order Specific Question:   Is patient pregnant?    Answer:   No    No results found for this or any previous visit (from the past 24 hour(s)). Ct Head Wo Contrast  Result Date: 06/26/2018 CLINICAL  DATA:  42 year old female status post fall, maxillofacial blunt trauma. Left eye, nose and upper dentition pain. Pain from the left side of the head radiating into the neck. EXAM: CT HEAD WITHOUT CONTRAST CT MAXILLOFACIAL WITHOUT CONTRAST TECHNIQUE: Multidetector CT imaging of the head and maxillofacial structures were performed using the standard protocol without intravenous contrast. Multiplanar CT image reconstructions of the maxillofacial structures were also generated. COMPARISON:  Head and cervical spine CT 01/01/2018. FINDINGS: CT HEAD FINDINGS Brain: Stable cerebral volume. No midline shift, ventriculomegaly, mass effect, evidence of mass lesion, intracranial hemorrhage or evidence of cortically based acute infarction. Gray-white matter differentiation is within normal limits throughout the brain. Vascular: No suspicious intracranial vascular hyperdensity. Skull: Stable and intact. Other: No scalp hematoma identified. No scalp soft tissue gas. CT MAXILLOFACIAL FINDINGS Osseous:  Mandible intact. Maxilla and nasal bones appear intact. No dental abnormality identified. Zygoma and central skull base intact. Visible cervical spine appears stable and intact. Orbits: Intact orbital walls. Symmetric and normal orbits soft tissues. Sinuses: Paranasal sinuses and mastoids are stable and well pneumatized. Bilateral tympanic cavities are clear. Soft tissues: No superficial soft tissue injury identified. Negative visible noncontrast larynx, pharynx, parapharyngeal spaces, retropharyngeal space, sublingual space, submandibular glands, parotid glands, masticator spaces. Upper cervical lymph nodes are within normal limits. IMPRESSION: 1. No facial fracture or acute traumatic injury identified. 2. Stable and normal noncontrast CT appearance of the brain. Electronically Signed   By: Genevie Ann M.D.   On: 06/26/2018 14:24   Ct Maxillofacial Wo Contrast  Result Date: 06/26/2018 CLINICAL DATA:  42 year old female status post  fall, maxillofacial blunt trauma. Left eye, nose and upper dentition pain. Pain from the left side of the head radiating into the neck. EXAM: CT HEAD WITHOUT CONTRAST CT MAXILLOFACIAL WITHOUT CONTRAST TECHNIQUE: Multidetector CT imaging of the head and maxillofacial structures were performed using the standard protocol without intravenous contrast. Multiplanar CT image reconstructions of the maxillofacial structures were also generated. COMPARISON:  Head and cervical spine CT 01/01/2018. FINDINGS: CT HEAD FINDINGS Brain: Stable cerebral volume. No midline shift, ventriculomegaly, mass effect, evidence of mass lesion, intracranial hemorrhage or evidence of cortically based acute infarction. Gray-white matter differentiation is within normal limits throughout the brain. Vascular: No suspicious intracranial vascular hyperdensity. Skull: Stable and intact. Other: No scalp hematoma identified. No scalp soft tissue gas. CT MAXILLOFACIAL FINDINGS Osseous: Mandible intact. Maxilla and nasal bones appear intact. No dental abnormality identified. Zygoma and central skull base intact. Visible cervical spine appears stable and intact. Orbits: Intact orbital walls. Symmetric and normal orbits soft tissues. Sinuses: Paranasal sinuses and mastoids are stable and well pneumatized. Bilateral tympanic cavities are clear. Soft tissues: No superficial soft tissue injury identified. Negative visible noncontrast larynx, pharynx, parapharyngeal spaces, retropharyngeal space, sublingual space, submandibular glands, parotid glands, masticator spaces. Upper cervical lymph nodes are within normal limits. IMPRESSION: 1. No facial fracture or acute traumatic injury identified. 2. Stable and normal noncontrast CT appearance of the brain. Electronically Signed   By: Genevie Ann M.D.   On: 06/26/2018 14:24    ED Clinical Impression  Facial injury, initial encounter  Fall, initial encounter  Strain of neck muscle, initial encounter   ED  Assessment/Plan  will get a CT of the head and facial bones fracture, intracranial injury.  Do not think that she needs a CT C-spine.  Will reevaluate.  D/w insurance company-peer to peer for preauthorization.  Reviewed imaging independently.  No facial fracture, acute intracranial injury.  See radiology report for full details.  Patient with a nasal laceration, blunt facial trauma s/p medical fall with also left trapezial strain/spasm.  No radiographic evidence of an emergency.  Will send home with Tylenol, ibuprofen, Zanaflex.  States that she has a prescription for Zanaflex and has not yet tried it. Follow-up with PMD as needed, to the ER if she gets worse.  Discussed  imaging, MDM, treatment plan, and plan for follow-up with patient. Discussed sn/sx that should prompt return to the ED. patient agrees with plan.   Meds ordered this encounter  Medications  . ibuprofen (ADVIL,MOTRIN) 600 MG tablet    Sig: Take 1 tablet (600 mg total) by mouth every 6 (six) hours as needed.    Dispense:  30 tablet    Refill:  0    *This clinic note  was created using [employer]. Therefore, there may be occasional mistakes despite careful proofreading.   ?   Melynda Ripple, MD 06/26/18 2039

## 2018-06-26 NOTE — Discharge Instructions (Addendum)
Put bacitracin or another antibiotic ointment on the laceration on your nose.  You may take 600 mg ibuprofen with a thousand milligrams, which is 2 extra strength Tylenol, together 3 or 4 times a day as needed for pain.  This is a very effective combination for pain.  Start taking the Zanaflex for muscle spasms.  Try starting at 2 mg 3 times a day, and if that is not working, then you can increase it to 4 mg 3 times a day.  Go immediately to the ER if your vision gets worse, if you have pain not controlled with medications, arm or leg weakness, facial droop, slurred speech, fevers above 100.4, or for any concerns.  Otherwise, I would follow-up with your primary care physician as needed.

## 2018-06-26 NOTE — Telephone Encounter (Signed)
Incoming call from Patient, with a complaint of falling on yesterday over a trash can and nipping her nose over the trash can.  Patient rates the pain 5 to 6 on a scale scale of 1 to 10.  Patient states that  it bled for eight hours.  A constant drip.   At one point both nostrils were bleeding. Patient states area is swollen and puffy. Patient also complains of headache,  neck and bach pain.  Patient states she saw stars.  Assessed if any appointment slots available. None available.  Call to the office.  They will call Patient with an availability. Patient voiced  Understanding. Dr. Wynetta Emery recommended that Patient go to Urgent care for xray.  Patient voiced understanding.        Reason for Disposition . [1] FB removed AND [2] pain persists > 2 hours  Answer Assessment - Initial Assessment Questions 1. OBJECT: "What do you think it is?"      Patient states she fell yesterday and hit  The edge of the garbage can.        3. PAIN: "Are you having any pain?" If so, ask: "How bad is it?"  (Scale 1-10; or mild, moderate, severe)     Rates the pain 5 to 6 on a scale of 1 to 10 4. SYMPTOMS: "Is it causing any other symptoms?" (e.g., nasal discharge)     States it bleed for 8 hours.   A constant drip.   At one point if was bleeding out of both nostrils  Protocols used: NOSE - FOREIGN BODY-A-AH

## 2018-06-26 NOTE — ED Triage Notes (Signed)
Patient stated she fell yesterday and hit the rim of the trash can yesterday. Patient stated her nose bled and she has pain to the right and left side of her face

## 2018-07-07 ENCOUNTER — Ambulatory Visit: Payer: Self-pay | Admitting: Internal Medicine

## 2018-07-19 ENCOUNTER — Ambulatory Visit (INDEPENDENT_AMBULATORY_CARE_PROVIDER_SITE_OTHER): Payer: BLUE CROSS/BLUE SHIELD | Admitting: Gastroenterology

## 2018-07-19 ENCOUNTER — Encounter: Payer: Self-pay | Admitting: Gastroenterology

## 2018-07-19 VITALS — BP 100/66 | HR 85 | Ht 67.0 in | Wt 133.0 lb

## 2018-07-19 DIAGNOSIS — R1310 Dysphagia, unspecified: Secondary | ICD-10-CM | POA: Diagnosis not present

## 2018-07-19 DIAGNOSIS — K5900 Constipation, unspecified: Secondary | ICD-10-CM | POA: Diagnosis not present

## 2018-07-19 NOTE — Progress Notes (Signed)
Jonathon Bellows MD, MRCP(U.K) [address]  [place]  [place], [place] [address]  Main: [phone]  Fax: [phone]   Primary Care Physician: Valerie Roys, DO  Primary Gastroenterologist:  Dr. Jonathon Bellows   Chief Complaint  Patient presents with  . Follow-up    Dysphagia, constipation    HPI: Diana Daniels is a 42 y.o. female    Summary of history :  Here to see me for a follow up . Initially referred and seen back in 09/2017 for constipation which she has had her whole life. She has a leidin V mutation , She has leucopenia and says been low last 10 years- unclear reason. At that time she also had some issues with dysphagia. At her last visit she didn't want to change in medications for her constipation and her diet was very low in fibre.    Interval history   10/26/2017-  07/19/2018   12/01/17 : EGD: Normal-normal esophageal bx , colonoscopy single shallow ulcer noted, small tubular adenoma excised.   She says here for a follow up . No new issues.   Still feels something stuck in her throat. Does not take her PPI daily, takes it as need it.   Current Outpatient Medications  Medication Sig Dispense Refill  . amphetamine-dextroamphetamine (ADDERALL) 20 MG tablet Take 1 tablet (20 mg total) by mouth 2 (two) times daily. 60 tablet 0  . buPROPion (WELLBUTRIN) 100 MG tablet Take 100 mg by mouth daily.    . calcium-vitamin D (OSCAL WITH D) 500-200 MG-UNIT tablet Take 2 tablets by mouth.    . clonazePAM (KLONOPIN) 1 MG tablet Take 1 tablet (1 mg total) by mouth as needed for anxiety. 30 tablet 0  . FLUoxetine (PROZAC) 10 MG tablet Take 10 mg by mouth daily.    Marland Kitchen FLUoxetine (PROZAC) 20 MG capsule Take 20 mg by mouth daily.    Marland Kitchen FLUoxetine (PROZAC) 40 MG capsule   0  . ibuprofen (ADVIL,MOTRIN) 600 MG tablet Take 1 tablet (600 mg total) by mouth every 6 (six) hours as needed. 30 tablet 0  . Multiple Vitamin (MULTIVITAMIN) capsule Take 1 capsule by mouth daily.    Marland Kitchen  omeprazole (PRILOSEC) 40 MG capsule Take 1 capsule (40 mg total) by mouth daily. 90 capsule 3  . rivaroxaban (XARELTO) 20 MG TABS tablet Take 1 tablet (20 mg total) by mouth daily. 30 tablet 1  . tiZANidine (ZANAFLEX) 2 MG tablet Take 1 tablet by mouth daily.     No current facility-administered medications for this visit.     Allergies as of 07/19/2018 - Review Complete 06/26/2018  Allergen Reaction Noted  . Propofol Other (See Comments) 10/26/2017    ROS:  General: Negative for anorexia, weight loss, fever, chills, fatigue, weakness. ENT: Negative for hoarseness, difficulty swallowing , nasal congestion. CV: Negative for chest pain, angina, palpitations, dyspnea on exertion, peripheral edema.  Respiratory: Negative for dyspnea at rest, dyspnea on exertion, cough, sputum, wheezing.  GI: See history of present illness. GU:  Negative for dysuria, hematuria, urinary incontinence, urinary frequency, nocturnal urination.  Endo: Negative for unusual weight change.    Physical Examination:   BP 100/66   Pulse 85   Ht 5\' 7"  (1.702 m)   Wt 133 lb (60.3 kg)   BMI 20.83 kg/m   General: Well-nourished, well-developed in no acute distress.  Eyes: No icterus. Conjunctivae pink. Mouth: Oropharyngeal mucosa moist and pink , no lesions erythema or exudate. Lungs: Clear to auscultation  bilaterally. Non-labored. Heart: Regular rate and rhythm, no murmurs rubs or gallops.  Abdomen: Bowel sounds are normal, nontender, nondistended, no hepatosplenomegaly or masses, no abdominal bruits or hernia , no rebound or guarding.   Extremities: No lower extremity edema. No clubbing or deformities. Neuro: Alert and oriented x 3.  Grossly intact. Skin: Warm and dry, no jaundice.   Psych: Alert and cooperative, normal mood and affect.   Imaging Studies: Ct Head Wo Contrast  Result Date: 06/26/2018 CLINICAL DATA:  42 year old female status post fall, maxillofacial blunt trauma. Left eye, nose and upper  dentition pain. Pain from the left side of the head radiating into the neck. EXAM: CT HEAD WITHOUT CONTRAST CT MAXILLOFACIAL WITHOUT CONTRAST TECHNIQUE: Multidetector CT imaging of the head and maxillofacial structures were performed using the standard protocol without intravenous contrast. Multiplanar CT image reconstructions of the maxillofacial structures were also generated. COMPARISON:  Head and cervical spine CT 01/01/2018. FINDINGS: CT HEAD FINDINGS Brain: Stable cerebral volume. No midline shift, ventriculomegaly, mass effect, evidence of mass lesion, intracranial hemorrhage or evidence of cortically based acute infarction. Gray-white matter differentiation is within normal limits throughout the brain. Vascular: No suspicious intracranial vascular hyperdensity. Skull: Stable and intact. Other: No scalp hematoma identified. No scalp soft tissue gas. CT MAXILLOFACIAL FINDINGS Osseous: Mandible intact. Maxilla and nasal bones appear intact. No dental abnormality identified. Zygoma and central skull base intact. Visible cervical spine appears stable and intact. Orbits: Intact orbital walls. Symmetric and normal orbits soft tissues. Sinuses: Paranasal sinuses and mastoids are stable and well pneumatized. Bilateral tympanic cavities are clear. Soft tissues: No superficial soft tissue injury identified. Negative visible noncontrast larynx, pharynx, parapharyngeal spaces, retropharyngeal space, sublingual space, submandibular glands, parotid glands, masticator spaces. Upper cervical lymph nodes are within normal limits. IMPRESSION: 1. No facial fracture or acute traumatic injury identified. 2. Stable and normal noncontrast CT appearance of the brain. Electronically Signed   By: Genevie Ann M.D.   On: 06/26/2018 14:24   Ct Maxillofacial Wo Contrast  Result Date: 06/26/2018 CLINICAL DATA:  42 year old female status post fall, maxillofacial blunt trauma. Left eye, nose and upper dentition pain. Pain from the left side  of the head radiating into the neck. EXAM: CT HEAD WITHOUT CONTRAST CT MAXILLOFACIAL WITHOUT CONTRAST TECHNIQUE: Multidetector CT imaging of the head and maxillofacial structures were performed using the standard protocol without intravenous contrast. Multiplanar CT image reconstructions of the maxillofacial structures were also generated. COMPARISON:  Head and cervical spine CT 01/01/2018. FINDINGS: CT HEAD FINDINGS Brain: Stable cerebral volume. No midline shift, ventriculomegaly, mass effect, evidence of mass lesion, intracranial hemorrhage or evidence of cortically based acute infarction. Gray-white matter differentiation is within normal limits throughout the brain. Vascular: No suspicious intracranial vascular hyperdensity. Skull: Stable and intact. Other: No scalp hematoma identified. No scalp soft tissue gas. CT MAXILLOFACIAL FINDINGS Osseous: Mandible intact. Maxilla and nasal bones appear intact. No dental abnormality identified. Zygoma and central skull base intact. Visible cervical spine appears stable and intact. Orbits: Intact orbital walls. Symmetric and normal orbits soft tissues. Sinuses: Paranasal sinuses and mastoids are stable and well pneumatized. Bilateral tympanic cavities are clear. Soft tissues: No superficial soft tissue injury identified. Negative visible noncontrast larynx, pharynx, parapharyngeal spaces, retropharyngeal space, sublingual space, submandibular glands, parotid glands, masticator spaces. Upper cervical lymph nodes are within normal limits. IMPRESSION: 1. No facial fracture or acute traumatic injury identified. 2. Stable and normal noncontrast CT appearance of the brain. Electronically Signed   By: Herminio Heads.D.  On: 06/26/2018 14:24    Assessment and Plan:   MERIDITH ROMICK is a 42 y.o. y/o female here to follow uo for constipation and dysphagia. She is willing to try medications for constipation. Suspect dysphagia likely due to GERD.   Plan  1. She is not keen on  daily PPI- suggested to change Pepcid.  2. Trial of Trulance will give samples for a week.    Dr Jonathon Bellows  MD,MRCP [facility]) Follow up PRN

## 2020-01-16 ENCOUNTER — Telehealth: Payer: Self-pay | Admitting: Family Medicine

## 2020-01-16 ENCOUNTER — Ambulatory Visit: Payer: Self-pay

## 2020-01-16 NOTE — Telephone Encounter (Signed)
Patient had to cancel appt but wants to know will she still be able to get all 3 HPV vaccine before she turns 45

## 2020-01-16 NOTE — Telephone Encounter (Signed)
Client counseled on Gardasil vaccine schedule and when would need to receive vaccine doses to completed series by age 44 years. Questions answered. Rich Number, RN

## 2020-01-17 ENCOUNTER — Encounter: Payer: BLUE CROSS/BLUE SHIELD | Admitting: Family Medicine

## 2020-02-05 ENCOUNTER — Other Ambulatory Visit: Payer: Self-pay

## 2020-02-05 ENCOUNTER — Ambulatory Visit (LOCAL_COMMUNITY_HEALTH_CENTER): Payer: Commercial Managed Care - HMO

## 2020-02-05 DIAGNOSIS — Z23 Encounter for immunization: Secondary | ICD-10-CM

## 2020-02-05 NOTE — Progress Notes (Signed)
Reports completed Moderna Covid vaccine series 11/01/2019 (right arm). Rich Number, RN  Client thinks has had a tetanus containing vaccine in last 10 years, but unsure if Tdap. Plans to check with PCP and if needed, will notify RN at next appt for second Gardasil. Rich Number, RN

## 2020-03-04 ENCOUNTER — Ambulatory Visit (LOCAL_COMMUNITY_HEALTH_CENTER): Payer: Commercial Managed Care - HMO

## 2020-03-04 ENCOUNTER — Other Ambulatory Visit: Payer: Self-pay

## 2020-03-04 DIAGNOSIS — Z23 Encounter for immunization: Secondary | ICD-10-CM

## 2020-07-31 ENCOUNTER — Other Ambulatory Visit: Payer: Self-pay

## 2020-07-31 ENCOUNTER — Ambulatory Visit (LOCAL_COMMUNITY_HEALTH_CENTER): Payer: Commercial Managed Care - HMO

## 2020-07-31 DIAGNOSIS — Z23 Encounter for immunization: Secondary | ICD-10-CM

## 2021-07-08 ENCOUNTER — Other Ambulatory Visit: Payer: Self-pay | Admitting: Internal Medicine

## 2021-07-08 DIAGNOSIS — R202 Paresthesia of skin: Secondary | ICD-10-CM

## 2021-08-12 ENCOUNTER — Other Ambulatory Visit: Payer: Self-pay | Admitting: Physician Assistant

## 2021-08-12 DIAGNOSIS — R200 Anesthesia of skin: Secondary | ICD-10-CM

## 2021-08-12 DIAGNOSIS — R42 Dizziness and giddiness: Secondary | ICD-10-CM

## 2021-08-12 DIAGNOSIS — M542 Cervicalgia: Secondary | ICD-10-CM

## 2021-08-12 DIAGNOSIS — H5462 Unqualified visual loss, left eye, normal vision right eye: Secondary | ICD-10-CM

## 2021-08-12 DIAGNOSIS — R202 Paresthesia of skin: Secondary | ICD-10-CM

## 2021-08-14 ENCOUNTER — Encounter: Payer: Self-pay | Admitting: Oncology

## 2021-08-20 ENCOUNTER — Other Ambulatory Visit: Payer: Self-pay

## 2021-08-20 ENCOUNTER — Ambulatory Visit
Admission: RE | Admit: 2021-08-20 | Discharge: 2021-08-20 | Disposition: A | Discharge status: Home / Self Care | Payer: Managed Care, Other (non HMO) | Source: Ambulatory Visit | Attending: Physician Assistant | Admitting: Physician Assistant

## 2021-08-20 ENCOUNTER — Ambulatory Visit: Payer: Managed Care, Other (non HMO)

## 2021-08-20 DIAGNOSIS — R42 Dizziness and giddiness: Secondary | ICD-10-CM

## 2021-08-20 DIAGNOSIS — H5462 Unqualified visual loss, left eye, normal vision right eye: Secondary | ICD-10-CM

## 2021-09-30 ENCOUNTER — Other Ambulatory Visit: Payer: Self-pay | Admitting: Physician Assistant

## 2021-09-30 DIAGNOSIS — H532 Diplopia: Secondary | ICD-10-CM

## 2021-09-30 DIAGNOSIS — I671 Cerebral aneurysm, nonruptured: Secondary | ICD-10-CM

## 2021-09-30 DIAGNOSIS — R519 Headache, unspecified: Secondary | ICD-10-CM

## 2021-12-21 ENCOUNTER — Other Ambulatory Visit: Payer: Self-pay | Admitting: Physician Assistant

## 2021-12-21 DIAGNOSIS — I671 Cerebral aneurysm, nonruptured: Secondary | ICD-10-CM

## 2021-12-21 DIAGNOSIS — R519 Headache, unspecified: Secondary | ICD-10-CM

## 2021-12-21 DIAGNOSIS — H532 Diplopia: Secondary | ICD-10-CM

## 2021-12-21 DIAGNOSIS — R42 Dizziness and giddiness: Secondary | ICD-10-CM

## 2021-12-23 ENCOUNTER — Encounter: Payer: Self-pay | Admitting: Oncology

## 2021-12-24 ENCOUNTER — Other Ambulatory Visit: Payer: Self-pay | Admitting: Physician Assistant

## 2021-12-24 DIAGNOSIS — R200 Anesthesia of skin: Secondary | ICD-10-CM

## 2021-12-24 DIAGNOSIS — M542 Cervicalgia: Secondary | ICD-10-CM

## 2021-12-24 DIAGNOSIS — R42 Dizziness and giddiness: Secondary | ICD-10-CM

## 2021-12-30 ENCOUNTER — Other Ambulatory Visit: Payer: Managed Care, Other (non HMO)

## 2022-01-11 ENCOUNTER — Ambulatory Visit
Admission: RE | Admit: 2022-01-11 | Discharge: 2022-01-11 | Disposition: A | Discharge status: Home / Self Care | Payer: BLUE CROSS/BLUE SHIELD | Source: Ambulatory Visit | Attending: Physician Assistant | Admitting: Physician Assistant

## 2022-01-11 ENCOUNTER — Other Ambulatory Visit: Payer: BLUE CROSS/BLUE SHIELD

## 2022-01-11 DIAGNOSIS — R42 Dizziness and giddiness: Secondary | ICD-10-CM

## 2022-01-11 DIAGNOSIS — M542 Cervicalgia: Secondary | ICD-10-CM

## 2022-01-11 DIAGNOSIS — R519 Headache, unspecified: Secondary | ICD-10-CM

## 2022-01-11 DIAGNOSIS — H532 Diplopia: Secondary | ICD-10-CM

## 2022-01-11 DIAGNOSIS — I671 Cerebral aneurysm, nonruptured: Secondary | ICD-10-CM

## 2022-01-11 DIAGNOSIS — R200 Anesthesia of skin: Secondary | ICD-10-CM

## 2022-01-11 IMAGING — MR MR CERVICAL SPINE W/O CM
5 series · 36 of 48 positions shown · non-contrast
Comparison: Cervical spine CT [DATE].

CLINICAL DATA: Provided history: Cervicalgia. Numbness and
tingling. Vertigo. Additional history provided by scanning
technologist: Patient reports neck pain, bilateral arm and hand
numbness/weakness.

EXAM:
MRI CERVICAL SPINE WITHOUT CONTRAST
TECHNIQUE: Multiplanar, multisequence MR imaging of the cervical spine was
performed. No intravenous contrast was administered.

[Series 2: T2 · sagittal · 3.0mm · 0.41mm/px · 6 of 16 slices shown (1 of 2)]
[im 1/16]
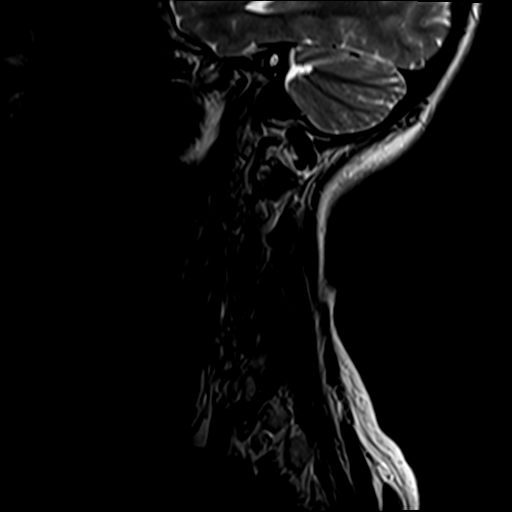
[im 4/16]
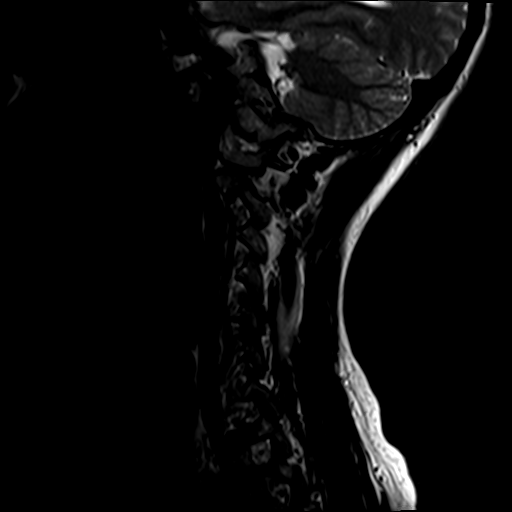
[im 7/16]
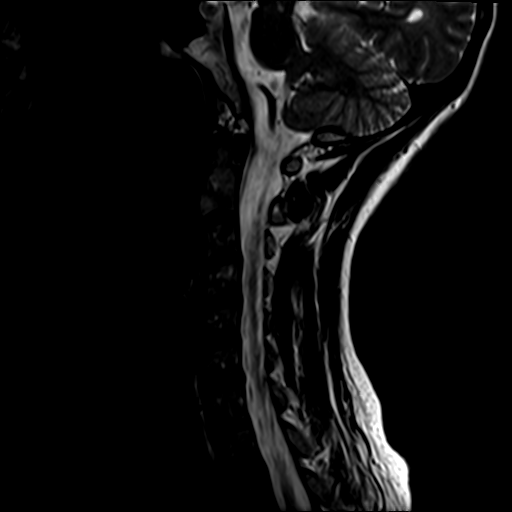
[im 10/16]
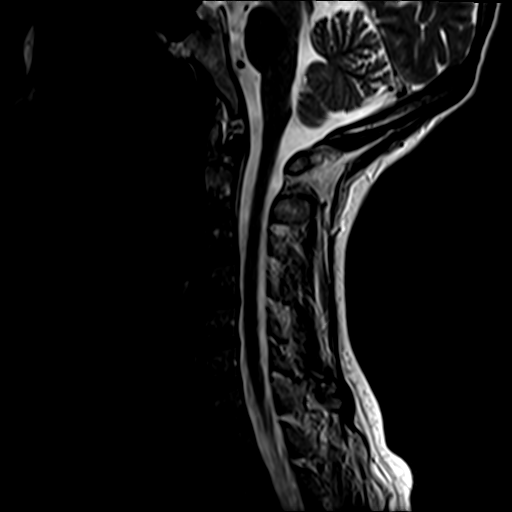
[im 13/16]
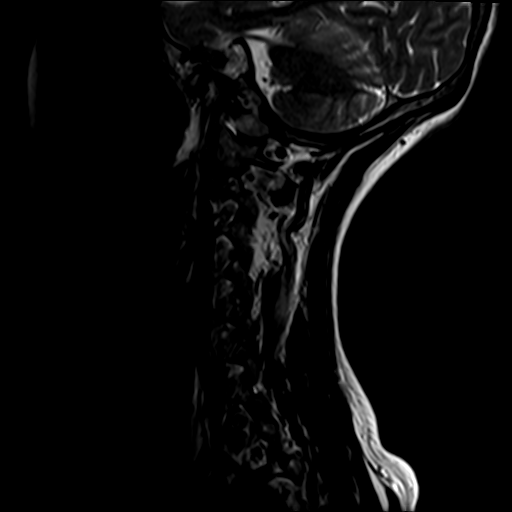
[im 16/16]
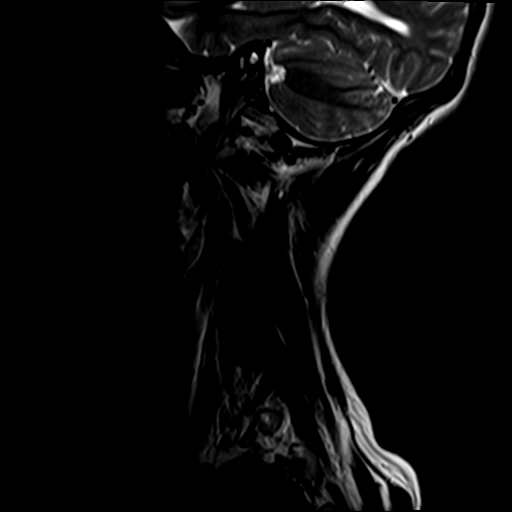

[Series 3: STIR · sagittal · 3.0mm · 0.82mm/px · 8 of 17 slices shown]
[im 1/17]
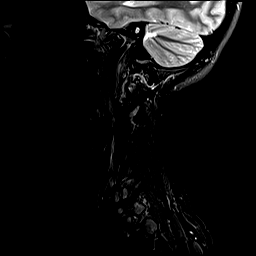
[im 3/17]
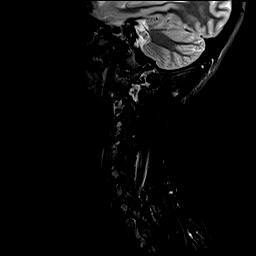
[im 5/17]
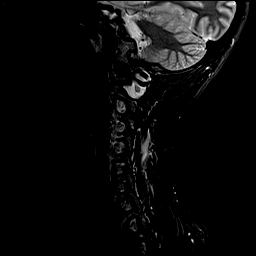
[im 7/17]
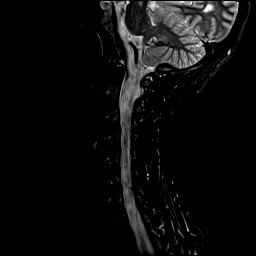
[im 10/17]
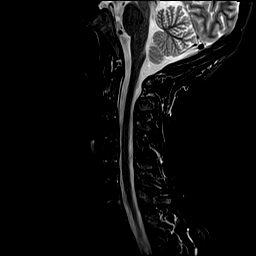
[im 12/17]
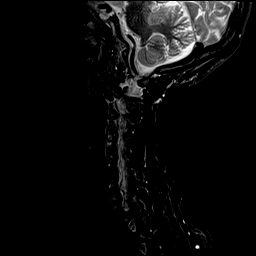
[im 14/17]
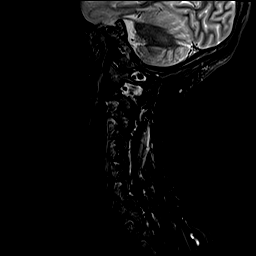
[im 17/17]
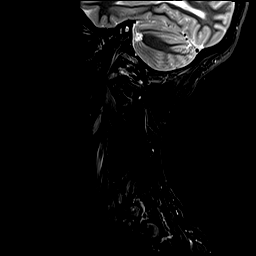

[Series 4: T1 · sagittal · 3.0mm · 0.82mm/px · 8 of 17 slices shown]
[im 1/17]
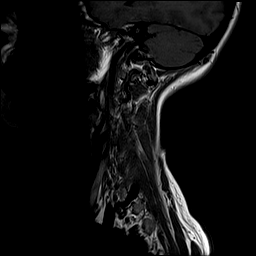
[im 3/17]
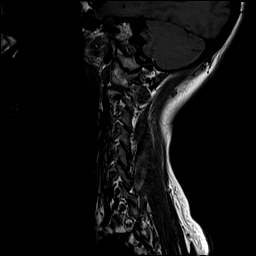
[im 5/17]
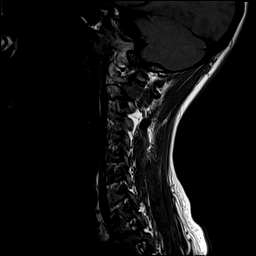
[im 7/17]
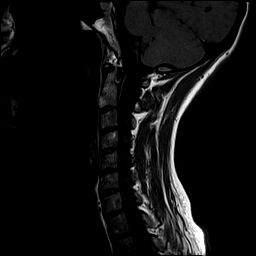
[im 10/17]
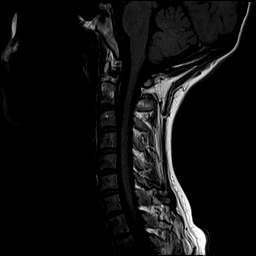
[im 12/17]
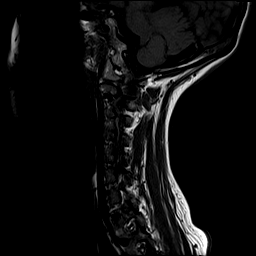
[im 14/17]
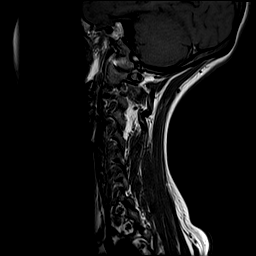
[im 17/17]
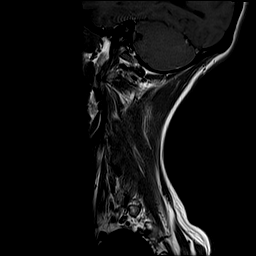

[Series 5: T2 · axial · 3.0mm · 0.70mm/px · z∈[-235,-133]mm · 9 of 29 slices shown (2 of 2)]
[im 1/29]
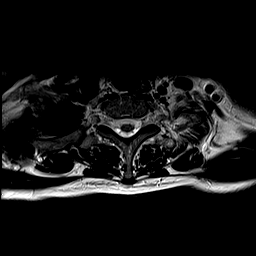
[im 5/29]
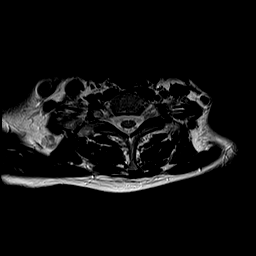
[im 10/29]
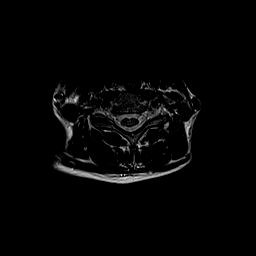
[im 12/29]
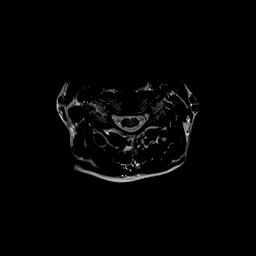
[im 15/29]
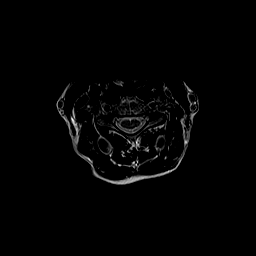
[im 17/29]
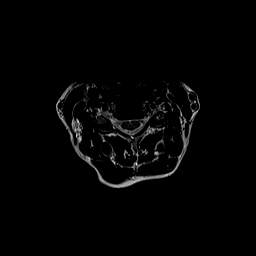
[im 19/29]
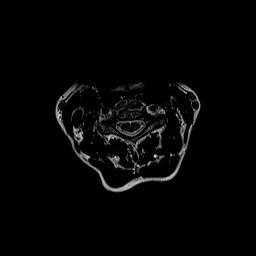
[im 24/29]
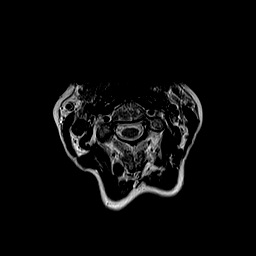
[im 29/29]
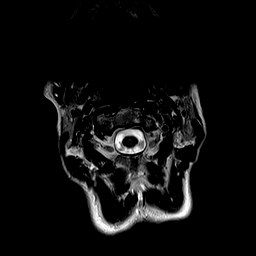

[Series 6: GRE · axial · 3.0mm · 0.35mm/px · z∈[-235,-177]mm · 5 of 29 slices shown]
[im 1/29]
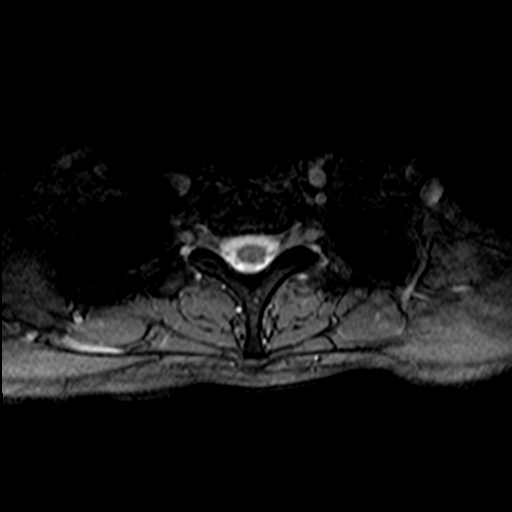
[im 5/29]
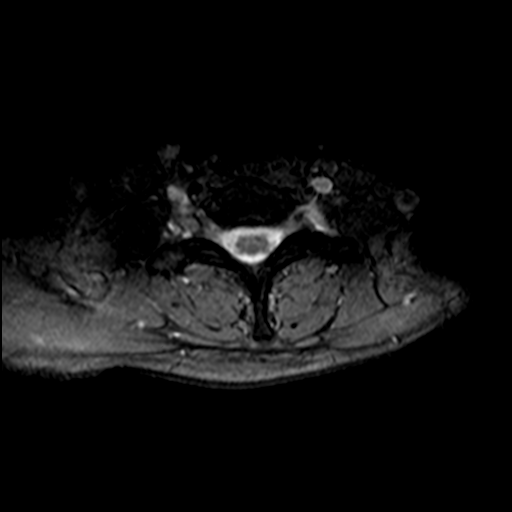
[im 10/29]
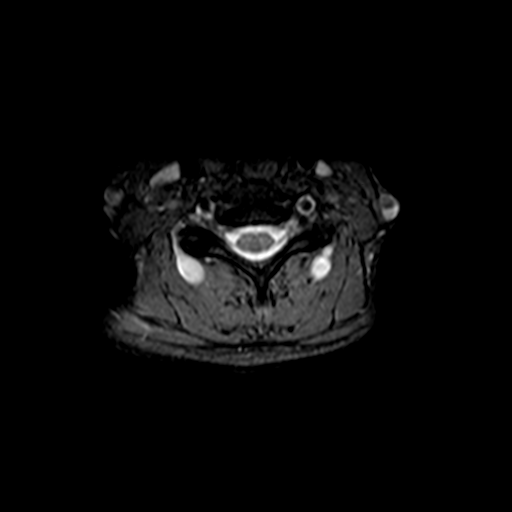
[im 12/29]
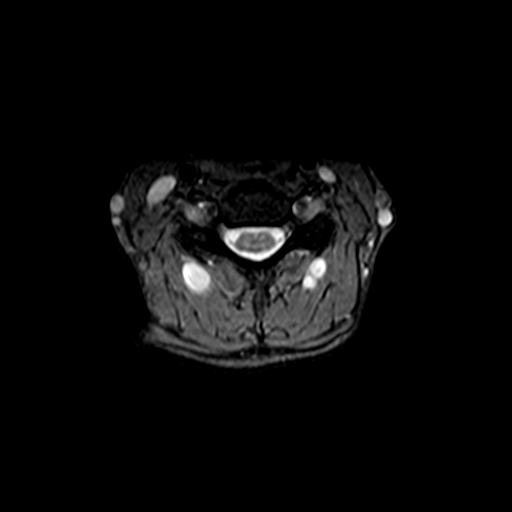
[im 17/29]
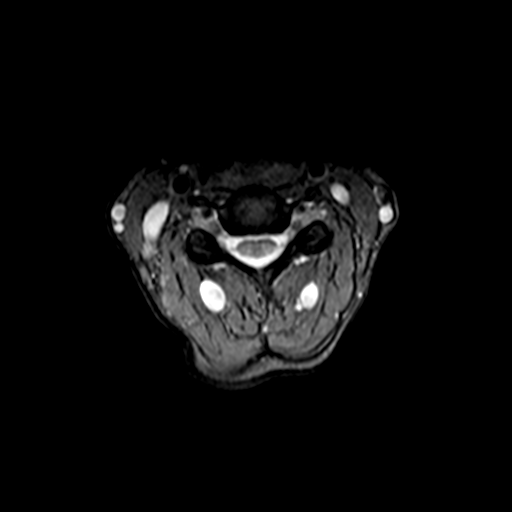

[36 of 48 positions shown; findings below may reference images not displayed]

FINDINGS: Mild intermittent motion degradation.

Alignment: Straightening of the expected cervical lordosis. No
significant spondylolisthesis.

Vertebrae: Vertebral body height is maintained. No significant
marrow edema or focal suspicious osseous lesion.

Cord: No signal abnormality identified within the cervical spinal
cord.

Posterior Fossa, vertebral arteries, paraspinal tissues: No
abnormality identified within included portions of the posterior
fossa. Flow voids preserved within the imaged cervical vertebral
arteries. No paraspinal mass or collection.

Disc levels:

No more than mild disc degeneration within the cervical spine.

C2-C3: No significant disc herniation or stenosis.

C3-C4: No significant disc herniation or stenosis.

C4-C5: Slight disc bulge. No significant spinal canal or foraminal
stenosis.

C5-C6: Slight disc bulge. No significant spinal canal or foraminal
stenosis.

C6-C7: Slight disc bulge. Uncovertebral hypertrophy on the right. No
significant spinal canal stenosis. Minimal relative right neural
foraminal narrowing.

C7-T1: No significant disc herniation or stenosis.
IMPRESSION: Mild cervical spondylosis, as outlined. No significant spinal canal
stenosis. Minimal neural foraminal narrowing on the right at C6-C7.

Nonspecific straightening of the expected cervical lordosis.

## 2022-01-11 IMAGING — MR MR MRV HEAD W/O CM
1 series · 25 of 48 positions shown · non-contrast
Comparison: Brain MRI [DATE].

CLINICAL DATA: Provided history: Cerebral aneurysm. Headache
disorder. Vertigo. Diplopia. Intractable headache, unspecified
chronicity pattern, unspecified headache type. Additional history
provided by scanning technologist: Patient reports vertigo, neck
pain, bilateral arm weakness/numbness.

EXAM:
MR VENOGRAM HEAD WITHOUT CONTRAST
TECHNIQUE: Angiographic images of the intracranial venous structures were
acquired using MRV technique without intravenous contrast.

[Series 2: (id) coronal · coronal · 3.0mm · 0.49mm/px · 25 of 95 slices shown]
[im 1/95]
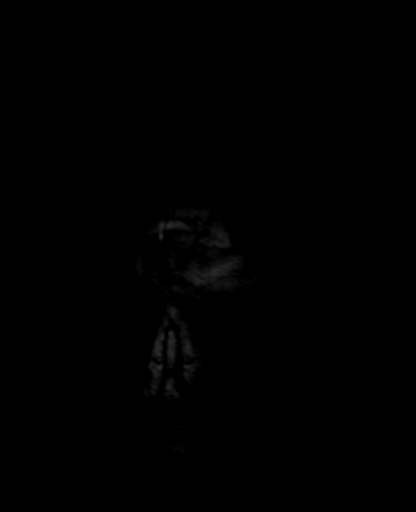
[im 3/95]
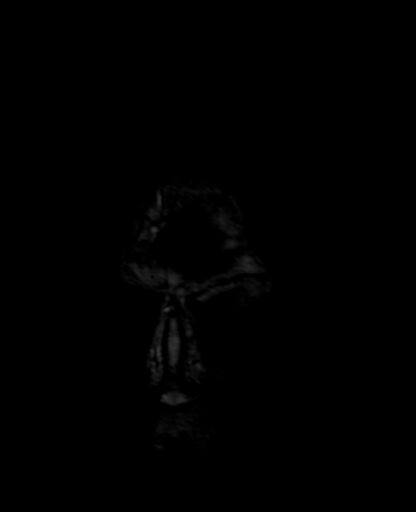
[im 5/95]
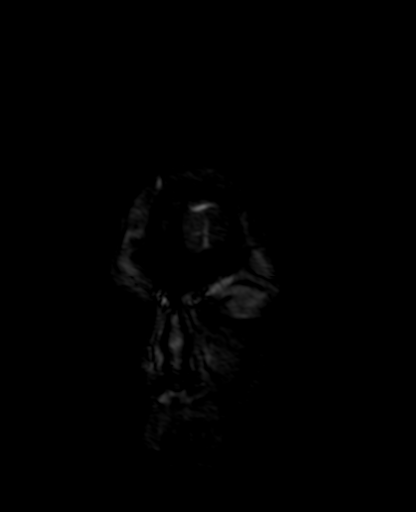
[im 7/95]
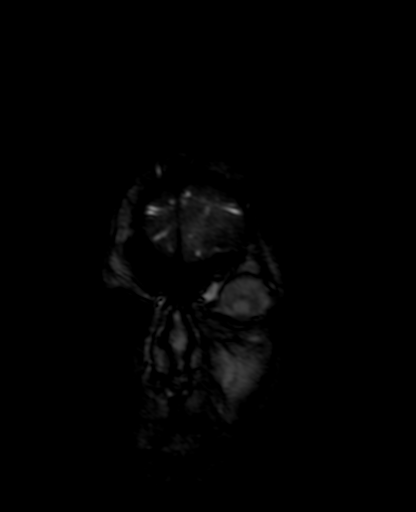
[im 9/95]
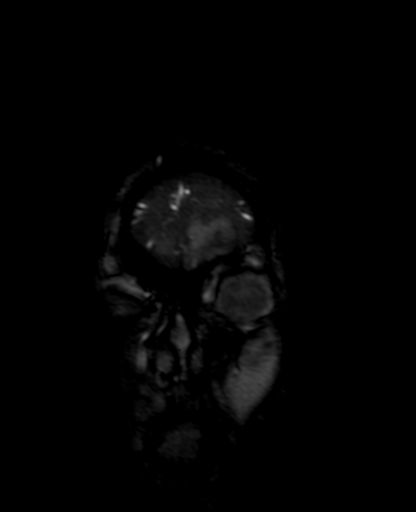
[im 11/95]
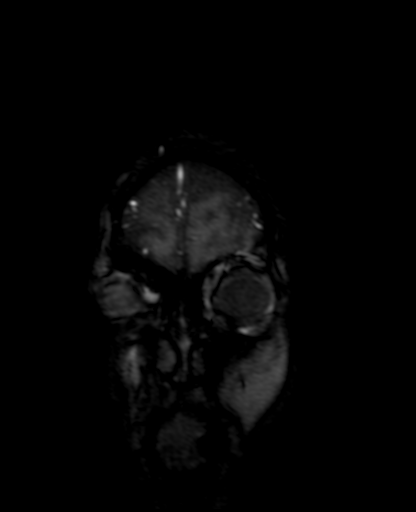
[im 13/95]
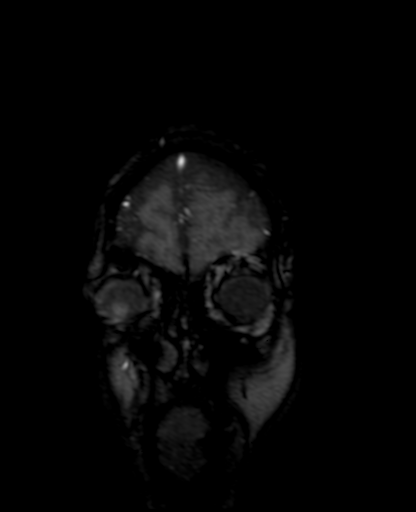
[im 15/95]
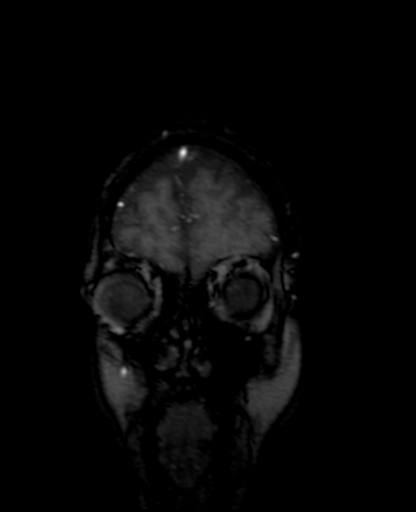
[im 17/95]
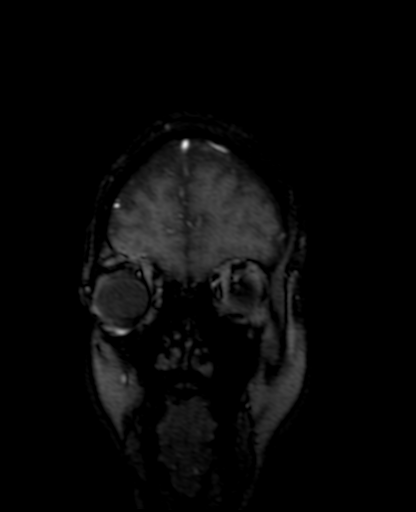
[im 19/95]
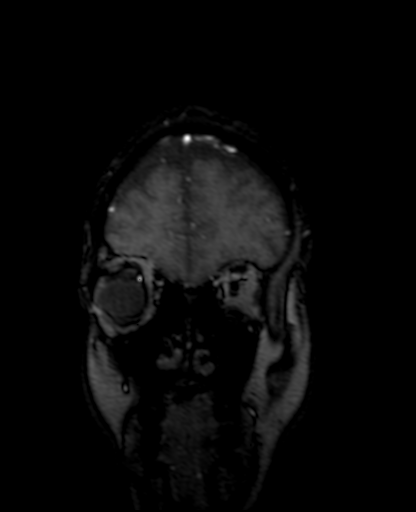
[im 21/95]
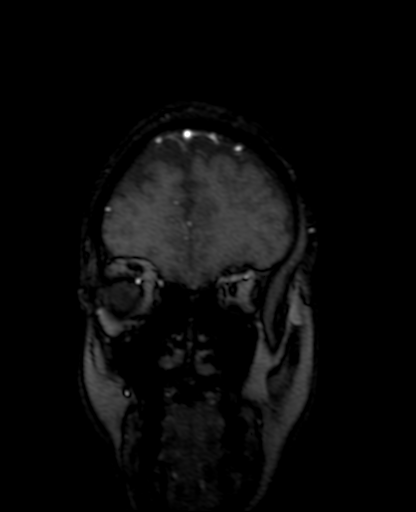
[im 23/95]
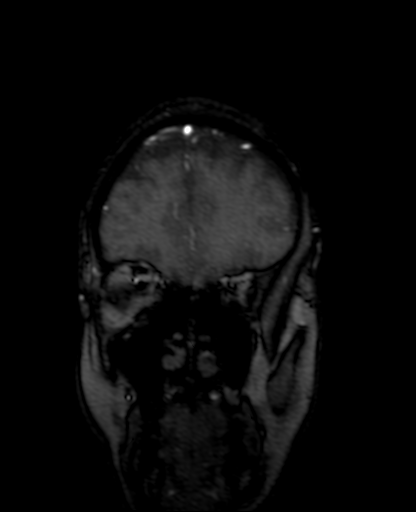
[im 25/95]
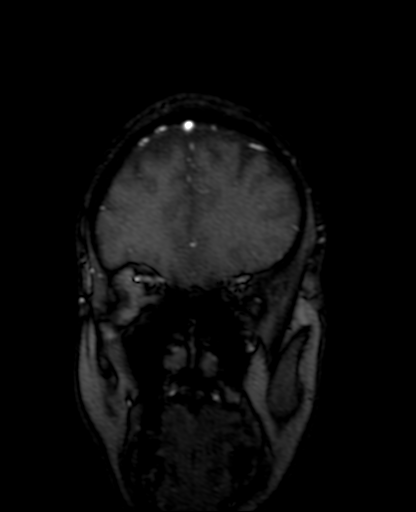
[im 27/95]
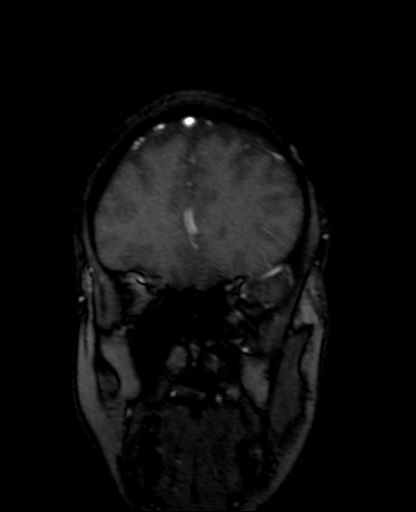
[im 29/95]
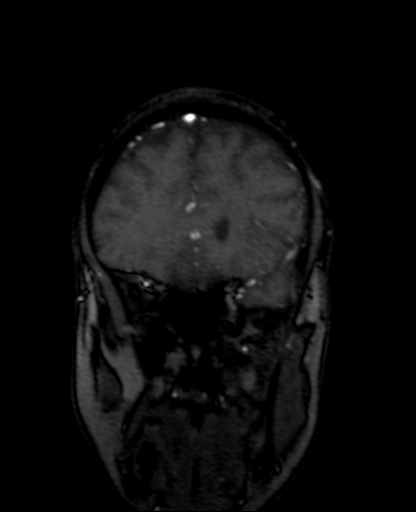
[im 31/95]
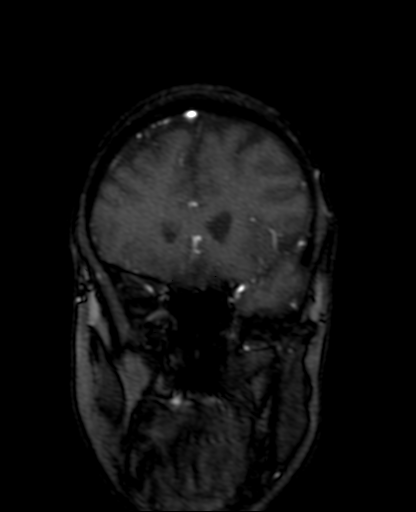
[im 33/95]
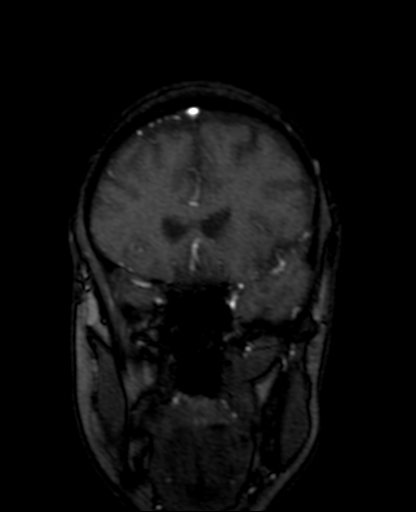
[im 35/95]
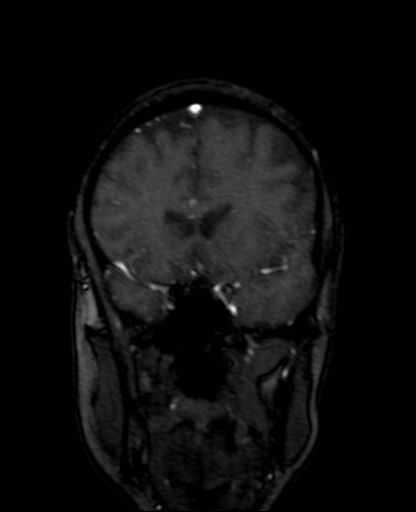
[im 43/95]
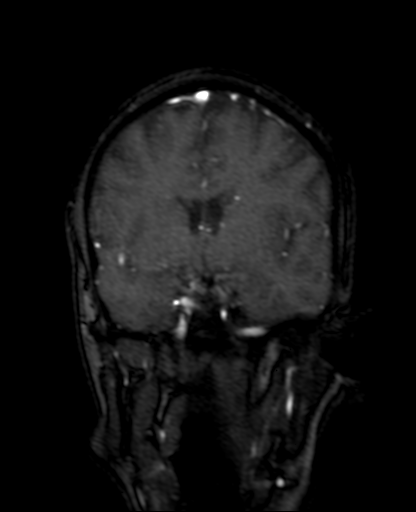
[im 49/95]
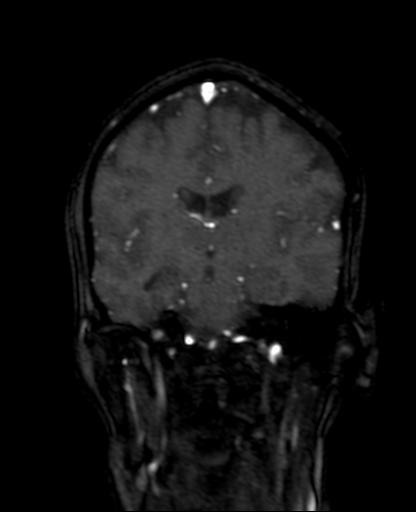
[im 55/95]
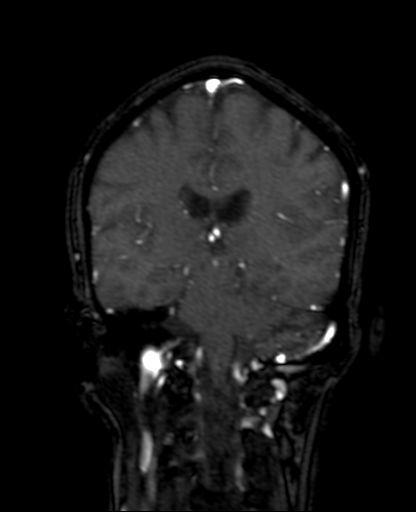
[im 67/95]
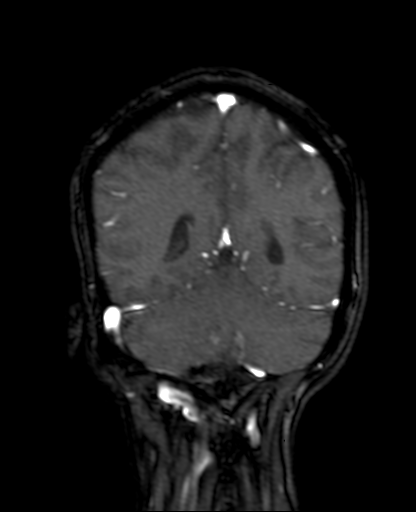
[im 79/95]
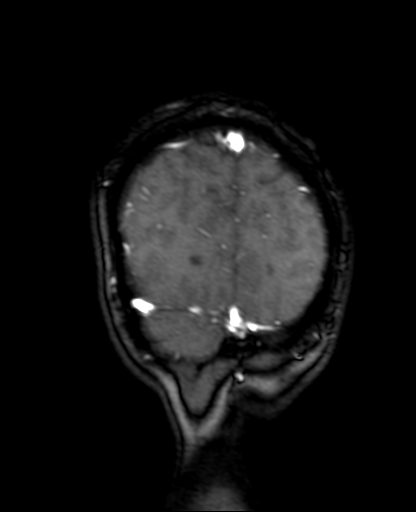
[im 81/95]
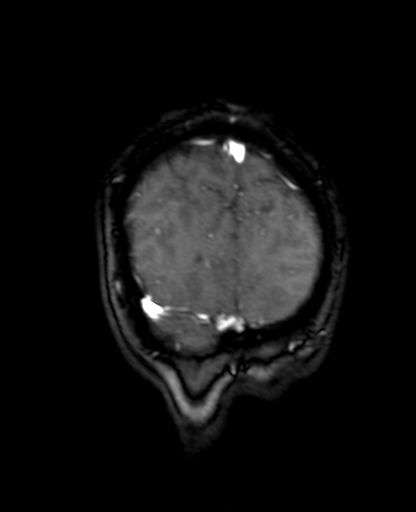
[im 91/95]
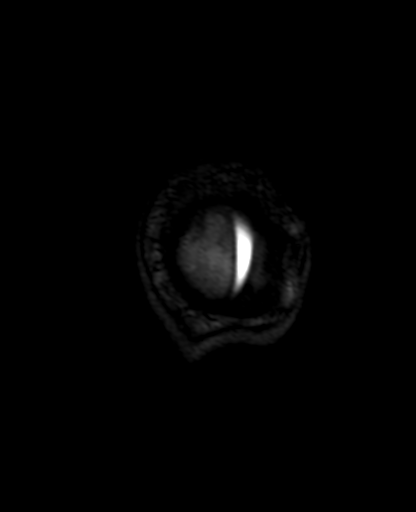

[25 of 48 positions shown; findings below may reference images not displayed]

FINDINGS: The superior sagittal sinus, internal cerebral veins, vein of AMPE,
straight sinus, transverse sinuses, sigmoid sinuses and visualized
jugular veins are patent. The right transverse and sigmoid dural
venous sinuses are dominant. No appreciable intracranial venous
thrombosis.
IMPRESSION: No evidence of intracranial venous thrombosis.

Please note, the intracranial arteries are not evaluated by MR
venography. If there is clinical concern for a cerebral aneurysm, MR
or CT angiography of the head is recommended for further evaluation.

## 2022-02-26 ENCOUNTER — Other Ambulatory Visit: Payer: Self-pay | Admitting: Internal Medicine

## 2022-02-26 DIAGNOSIS — Z1231 Encounter for screening mammogram for malignant neoplasm of breast: Secondary | ICD-10-CM

## 2022-08-02 DIAGNOSIS — Z419 Encounter for procedure for purposes other than remedying health state, unspecified: Secondary | ICD-10-CM | POA: Diagnosis not present

## 2022-09-02 DIAGNOSIS — Z419 Encounter for procedure for purposes other than remedying health state, unspecified: Secondary | ICD-10-CM | POA: Diagnosis not present

## 2022-10-01 DIAGNOSIS — Z419 Encounter for procedure for purposes other than remedying health state, unspecified: Secondary | ICD-10-CM | POA: Diagnosis not present

## 2022-10-27 ENCOUNTER — Encounter: Payer: Self-pay | Admitting: Oncology

## 2022-11-01 DIAGNOSIS — Z419 Encounter for procedure for purposes other than remedying health state, unspecified: Secondary | ICD-10-CM | POA: Diagnosis not present

## 2022-12-01 DIAGNOSIS — Z419 Encounter for procedure for purposes other than remedying health state, unspecified: Secondary | ICD-10-CM | POA: Diagnosis not present

## 2022-12-07 DIAGNOSIS — F332 Major depressive disorder, recurrent severe without psychotic features: Secondary | ICD-10-CM | POA: Diagnosis not present

## 2022-12-07 DIAGNOSIS — Z79899 Other long term (current) drug therapy: Secondary | ICD-10-CM | POA: Diagnosis not present

## 2022-12-07 DIAGNOSIS — F900 Attention-deficit hyperactivity disorder, predominantly inattentive type: Secondary | ICD-10-CM | POA: Diagnosis not present

## 2022-12-07 DIAGNOSIS — F4001 Agoraphobia with panic disorder: Secondary | ICD-10-CM | POA: Diagnosis not present

## 2023-01-01 DIAGNOSIS — Z419 Encounter for procedure for purposes other than remedying health state, unspecified: Secondary | ICD-10-CM | POA: Diagnosis not present

## 2023-01-10 DIAGNOSIS — L700 Acne vulgaris: Secondary | ICD-10-CM | POA: Diagnosis not present

## 2023-01-10 DIAGNOSIS — D6851 Activated protein C resistance: Secondary | ICD-10-CM | POA: Diagnosis not present

## 2023-01-10 DIAGNOSIS — M79674 Pain in right toe(s): Secondary | ICD-10-CM | POA: Diagnosis not present

## 2023-01-10 DIAGNOSIS — Z1231 Encounter for screening mammogram for malignant neoplasm of breast: Secondary | ICD-10-CM | POA: Diagnosis not present

## 2023-01-10 DIAGNOSIS — H169 Unspecified keratitis: Secondary | ICD-10-CM | POA: Diagnosis not present

## 2023-01-10 DIAGNOSIS — B351 Tinea unguium: Secondary | ICD-10-CM | POA: Diagnosis not present

## 2023-01-10 DIAGNOSIS — H02409 Unspecified ptosis of unspecified eyelid: Secondary | ICD-10-CM | POA: Diagnosis not present

## 2023-01-10 DIAGNOSIS — M21612 Bunion of left foot: Secondary | ICD-10-CM | POA: Diagnosis not present

## 2023-01-11 ENCOUNTER — Other Ambulatory Visit: Payer: Self-pay | Admitting: Internal Medicine

## 2023-01-11 DIAGNOSIS — Z1231 Encounter for screening mammogram for malignant neoplasm of breast: Secondary | ICD-10-CM

## 2023-01-19 DIAGNOSIS — M79674 Pain in right toe(s): Secondary | ICD-10-CM | POA: Diagnosis not present

## 2023-01-19 DIAGNOSIS — M2012 Hallux valgus (acquired), left foot: Secondary | ICD-10-CM | POA: Diagnosis not present

## 2023-01-19 DIAGNOSIS — B351 Tinea unguium: Secondary | ICD-10-CM | POA: Diagnosis not present

## 2023-01-19 DIAGNOSIS — M79672 Pain in left foot: Secondary | ICD-10-CM | POA: Diagnosis not present

## 2023-01-19 DIAGNOSIS — M79675 Pain in left toe(s): Secondary | ICD-10-CM | POA: Diagnosis not present

## 2023-01-31 DIAGNOSIS — Z419 Encounter for procedure for purposes other than remedying health state, unspecified: Secondary | ICD-10-CM | POA: Diagnosis not present

## 2023-02-11 ENCOUNTER — Encounter: Payer: Self-pay | Admitting: Oncology

## 2023-02-18 ENCOUNTER — Encounter: Payer: Self-pay | Admitting: Oncology

## 2023-02-18 ENCOUNTER — Ambulatory Visit
Admission: RE | Admit: 2023-02-18 | Discharge: 2023-02-18 | Disposition: A | Discharge status: Home / Self Care | Payer: Medicaid Other | Source: Ambulatory Visit | Attending: Internal Medicine | Admitting: Internal Medicine

## 2023-02-18 DIAGNOSIS — Z1231 Encounter for screening mammogram for malignant neoplasm of breast: Secondary | ICD-10-CM | POA: Diagnosis not present

## 2023-03-03 DIAGNOSIS — Z419 Encounter for procedure for purposes other than remedying health state, unspecified: Secondary | ICD-10-CM | POA: Diagnosis not present

## 2023-03-08 DIAGNOSIS — F4001 Agoraphobia with panic disorder: Secondary | ICD-10-CM | POA: Diagnosis not present

## 2023-03-08 DIAGNOSIS — F4000 Agoraphobia, unspecified: Secondary | ICD-10-CM | POA: Diagnosis not present

## 2023-03-08 DIAGNOSIS — F900 Attention-deficit hyperactivity disorder, predominantly inattentive type: Secondary | ICD-10-CM | POA: Diagnosis not present

## 2023-03-08 DIAGNOSIS — F4002 Agoraphobia without panic disorder: Secondary | ICD-10-CM | POA: Diagnosis not present

## 2023-03-08 DIAGNOSIS — F411 Generalized anxiety disorder: Secondary | ICD-10-CM | POA: Diagnosis not present

## 2023-03-08 DIAGNOSIS — F332 Major depressive disorder, recurrent severe without psychotic features: Secondary | ICD-10-CM | POA: Diagnosis not present

## 2023-04-03 DIAGNOSIS — Z419 Encounter for procedure for purposes other than remedying health state, unspecified: Secondary | ICD-10-CM | POA: Diagnosis not present

## 2023-04-08 DIAGNOSIS — R519 Headache, unspecified: Secondary | ICD-10-CM | POA: Diagnosis not present

## 2023-04-08 DIAGNOSIS — H02831 Dermatochalasis of right upper eyelid: Secondary | ICD-10-CM | POA: Diagnosis not present

## 2023-04-08 DIAGNOSIS — H532 Diplopia: Secondary | ICD-10-CM | POA: Diagnosis not present

## 2023-04-08 DIAGNOSIS — H02834 Dermatochalasis of left upper eyelid: Secondary | ICD-10-CM | POA: Diagnosis not present

## 2023-05-03 DIAGNOSIS — Z419 Encounter for procedure for purposes other than remedying health state, unspecified: Secondary | ICD-10-CM | POA: Diagnosis not present

## 2023-07-03 DIAGNOSIS — Z419 Encounter for procedure for purposes other than remedying health state, unspecified: Secondary | ICD-10-CM | POA: Diagnosis not present

## 2023-07-11 DIAGNOSIS — F4002 Agoraphobia without panic disorder: Secondary | ICD-10-CM | POA: Diagnosis not present

## 2023-07-11 DIAGNOSIS — F332 Major depressive disorder, recurrent severe without psychotic features: Secondary | ICD-10-CM | POA: Diagnosis not present

## 2023-07-11 DIAGNOSIS — F900 Attention-deficit hyperactivity disorder, predominantly inattentive type: Secondary | ICD-10-CM | POA: Diagnosis not present

## 2023-07-11 DIAGNOSIS — F411 Generalized anxiety disorder: Secondary | ICD-10-CM | POA: Diagnosis not present

## 2023-07-11 DIAGNOSIS — F4001 Agoraphobia with panic disorder: Secondary | ICD-10-CM | POA: Diagnosis not present

## 2023-07-11 DIAGNOSIS — F4000 Agoraphobia, unspecified: Secondary | ICD-10-CM | POA: Diagnosis not present

## 2023-07-12 DIAGNOSIS — G44221 Chronic tension-type headache, intractable: Secondary | ICD-10-CM | POA: Diagnosis not present

## 2023-07-12 DIAGNOSIS — M797 Fibromyalgia: Secondary | ICD-10-CM | POA: Diagnosis not present

## 2023-07-12 DIAGNOSIS — Z0000 Encounter for general adult medical examination without abnormal findings: Secondary | ICD-10-CM | POA: Diagnosis not present

## 2023-07-20 ENCOUNTER — Encounter: Payer: Self-pay | Admitting: *Deleted

## 2023-07-20 DIAGNOSIS — D729 Disorder of white blood cells, unspecified: Secondary | ICD-10-CM | POA: Diagnosis not present

## 2023-08-01 ENCOUNTER — Inpatient Hospital Stay: Payer: Medicaid Other | Attending: Oncology | Admitting: Oncology

## 2023-08-01 ENCOUNTER — Inpatient Hospital Stay: Payer: Medicaid Other

## 2023-08-01 ENCOUNTER — Encounter: Payer: Self-pay | Admitting: Oncology

## 2023-08-01 VITALS — BP 121/87 | HR 95 | Temp 98.3°F | Resp 16 | Ht 67.0 in | Wt 137.8 lb

## 2023-08-01 DIAGNOSIS — Z823 Family history of stroke: Secondary | ICD-10-CM | POA: Diagnosis not present

## 2023-08-01 DIAGNOSIS — I7300 Raynaud's syndrome without gangrene: Secondary | ICD-10-CM | POA: Diagnosis not present

## 2023-08-01 DIAGNOSIS — Z7901 Long term (current) use of anticoagulants: Secondary | ICD-10-CM | POA: Diagnosis not present

## 2023-08-01 DIAGNOSIS — D6851 Activated protein C resistance: Secondary | ICD-10-CM | POA: Insufficient documentation

## 2023-08-01 DIAGNOSIS — Z841 Family history of disorders of kidney and ureter: Secondary | ICD-10-CM | POA: Diagnosis not present

## 2023-08-01 DIAGNOSIS — D709 Neutropenia, unspecified: Secondary | ICD-10-CM

## 2023-08-01 DIAGNOSIS — Z803 Family history of malignant neoplasm of breast: Secondary | ICD-10-CM | POA: Insufficient documentation

## 2023-08-01 DIAGNOSIS — Z809 Family history of malignant neoplasm, unspecified: Secondary | ICD-10-CM | POA: Insufficient documentation

## 2023-08-01 DIAGNOSIS — M797 Fibromyalgia: Secondary | ICD-10-CM | POA: Insufficient documentation

## 2023-08-01 DIAGNOSIS — Z79899 Other long term (current) drug therapy: Secondary | ICD-10-CM | POA: Insufficient documentation

## 2023-08-01 DIAGNOSIS — R7989 Other specified abnormal findings of blood chemistry: Secondary | ICD-10-CM | POA: Diagnosis not present

## 2023-08-01 DIAGNOSIS — Z888 Allergy status to other drugs, medicaments and biological substances status: Secondary | ICD-10-CM | POA: Insufficient documentation

## 2023-08-01 LAB — CBC (CANCER CENTER ONLY)
HCT: 40 % (ref 36.0–46.0)
Hemoglobin: 13.7 g/dL (ref 12.0–15.0)
MCH: 31.7 pg (ref 26.0–34.0)
MCHC: 34.3 g/dL (ref 30.0–36.0)
MCV: 92.6 fL (ref 80.0–100.0)
Platelet Count: 187 10*3/uL (ref 150–400)
RBC: 4.32 MIL/uL (ref 3.87–5.11)
RDW: 12.3 % (ref 11.5–15.5)
WBC Count: 3.6 10*3/uL — ABNORMAL LOW (ref 4.0–10.5)
nRBC: 0 % (ref 0.0–0.2)

## 2023-08-01 LAB — FOLATE: Folate: 16.2 ng/mL (ref >5.9)

## 2023-08-01 LAB — IRON AND TIBC
Iron: 76 ug/dL (ref 28–170)
Saturation Ratios: 19 % (ref 10.4–31.8)
TIBC: 393 ug/dL (ref 250–450)
UIBC: 317 ug/dL

## 2023-08-01 LAB — VITAMIN B12: Vitamin B-12: 460 pg/mL (ref 180–914)

## 2023-08-01 LAB — FERRITIN: Ferritin: 14 ng/mL (ref 11–307)

## 2023-08-01 NOTE — Progress Notes (Signed)
[facility]  [phone] Fax:([phone]  ID: Diana Daniels OB: [birth year]  MR#: [id number]  FUX#:[id number]  Patient Care Team: Enid Baas, MD as PCP - General (Internal Medicine) Jeralyn Ruths, MD as Consulting Physician (Oncology)  CHIEF COMPLAINT: Chronic neutropenia.  INTERVAL HISTORY: Patient is a 47 year old female who was last evaluated in clinic greater than 5 years ago who is referred back for persistently decreased total white blood cell count and neutropenia.  She continues to feel well and remains asymptomatic.  She denies any repeated fevers or recent illnesses.  She has no neurologic complaints.  She has a good appetite and denies weight loss.  She has no chest pain, shortness of breath, cough, or hemoptysis.  She denies any nausea, vomiting, constipation, or diarrhea.  She has no urinary complaints.  Patient feels at her baseline and offers no specific complaints today.  REVIEW OF SYSTEMS:   Review of Systems  Constitutional: Negative.  Negative for fever, malaise/fatigue and weight loss.  Respiratory: Negative.  Negative for cough, hemoptysis and shortness of breath.   Cardiovascular: Negative.  Negative for chest pain and leg swelling.  Gastrointestinal: Negative.  Negative for abdominal pain.  Genitourinary: Negative.  Negative for dysuria.  Musculoskeletal: Negative.  Negative for back pain.  Skin: Negative.  Negative for rash.  Neurological: Negative.  Negative for dizziness, focal weakness, weakness and headaches.  Psychiatric/Behavioral: Negative.  The patient is not nervous/anxious.    As per HPI. Otherwise, a complete review of systems is negative.  PAST MEDICAL HISTORY: Past Medical History:  Diagnosis Date   Allergy    Anxiety    Attention deficit hyperactivity disorder (ADHD)    Celiac disease    Complication of anesthesia    Propofol intolerance   Depression    Factor 5 Leiden mutation, heterozygous  (HCC)    Fibromyalgia    Hypoglycemia    Raynaud's disease     PAST SURGICAL HISTORY: Past Surgical History:  Procedure Laterality Date   COLONOSCOPY N/A 12/01/2017   Procedure: COLONOSCOPY;  Surgeon: Wyline Mood, MD;  Location: [facility] ENDOSCOPY;  Service: Gastroenterology;  Laterality: N/A;  NO PROPOFOL   ESOPHAGOGASTRODUODENOSCOPY N/A 12/01/2017   Procedure: ESOPHAGOGASTRODUODENOSCOPY (EGD);  Surgeon: Wyline Mood, MD;  Location: [facility] ENDOSCOPY;  Service: Gastroenterology;  Laterality: N/A;  NO PROPOFOL.   FOOT SURGERY      FAMILY HISTORY: Family History  Problem Relation Age of Onset   Clotting disorder Father    Cancer Maternal Grandmother    Breast cancer Maternal Grandmother    Stroke Paternal Grandmother    Kidney disease Paternal Grandfather     ADVANCED DIRECTIVES (Y/N):  N  HEALTH MAINTENANCE: Social History   Tobacco Use   Smoking status: Never   Smokeless tobacco: Never  Vaping Use   Vaping status: Never Used  Substance Use Topics   Alcohol use: Never   Drug use: Never     Colonoscopy:  PAP:  Bone density:  Lipid panel:  Allergies  Allergen Reactions   Gabapentin Other (See Comments)    Dizziness, double vision, really bad headaches, seemed to make her pain worse   Gluten Meal Diarrhea and Nausea And Vomiting   Propofol Other (See Comments)    Nervous System malfunction    Current Outpatient Medications  Medication Sig Dispense Refill   amphetamine-dextroamphetamine (ADDERALL) 20 MG tablet Take 1 tablet (20 mg total) by mouth 2 (two) times daily. 60 tablet 0   buPROPion (WELLBUTRIN) 100 MG tablet Take  100 mg by mouth daily.     calcium-vitamin D (OSCAL WITH D) 500-200 MG-UNIT tablet Take 2 tablets by mouth.     clonazePAM (KLONOPIN) 1 MG tablet Take 1 tablet (1 mg total) by mouth as needed for anxiety. 30 tablet 0   FLUoxetine (PROZAC) 10 MG tablet Take 10 mg by mouth daily.     ibuprofen (ADVIL,MOTRIN) 600 MG tablet Take 1 tablet (600 mg total) by  mouth every 6 (six) hours as needed. 30 tablet 0   Multiple Vitamin (MULTIVITAMIN) capsule Take 1 capsule by mouth daily.     FLUoxetine (PROZAC) 20 MG capsule Take 20 mg by mouth daily. (Patient not taking: Reported on 08/01/2023)     rivaroxaban (XARELTO) 20 MG TABS tablet Take 1 tablet (20 mg total) by mouth daily. (Patient not taking: Reported on 08/01/2023) 30 tablet 1   No current facility-administered medications for this visit.    OBJECTIVE: Vitals:   08/01/23 1321  BP: 121/87  Pulse: 95  Resp: 16  Temp: 98.3 F (36.8 C)  SpO2: 100%     Body mass index is 21.58 kg/m.    ECOG FS:0 - Asymptomatic  General: Well-developed, well-nourished, no acute distress. Eyes: Pink conjunctiva, anicteric sclera. HEENT: Normocephalic, moist mucous membranes. Lungs: No audible wheezing or coughing. Heart: Regular rate and rhythm. Abdomen: Soft, nontender, no obvious distention. Musculoskeletal: No edema, cyanosis, or clubbing. Neuro: Alert, answering all questions appropriately. Cranial nerves grossly intact. Skin: No rashes or petechiae noted. Psych: Normal affect. Lymphatics: No cervical, calvicular, axillary or inguinal LAD.   LAB RESULTS:  Lab Results  Component Value Date   NA 137 07/28/2017   K 4.3 07/28/2017   CL 97 07/28/2017   CO2 26 07/28/2017   GLUCOSE 85 07/28/2017   BUN 9 07/28/2017   CREATININE 0.65 07/28/2017   CALCIUM 9.5 07/28/2017   PROT 7.1 07/28/2017   ALBUMIN 4.5 07/28/2017   AST 18 07/28/2017   ALT 13 07/28/2017   ALKPHOS 76 07/28/2017   BILITOT 0.4 07/28/2017   GFRNONAA 110 07/28/2017   GFRAA 127 07/28/2017    Lab Results  Component Value Date   WBC 3.6 (L) 08/01/2023   NEUTROABS 1.2 (L) 03/21/2018   HGB 13.7 08/01/2023   HCT 40.0 08/01/2023   MCV 92.6 08/01/2023   PLT 187 08/01/2023     STUDIES: No results found.  ASSESSMENT: Chronic neutropenia.  PLAN:    Chronic neutropenia: Patient's total white blood cell count and neutrophil  count remains decreased, but essentially unchanged since September 2010.  Her total white blood cell count today is 3.6.  Bone marrow biopsy in 2010 did not reveal any significant pathology.  All of her laboratory work from today is pending at time of dictation.  No intervention is needed at this time.  Patient does not require repeat bone marrow biopsy or any treatment.  She will have a video-assisted telemedicine visit in 4 weeks to discuss the results.  I spent a total of 45 minutes reviewing chart data, face-to-face evaluation with the patient, counseling and coordination of care as detailed above.   Patient expressed understanding and was in agreement with this plan. She also understands that She can call clinic at any time with any questions, concerns, or complaints.    Jeralyn Ruths, MD   08/01/2023 3:15 PM

## 2023-08-03 DIAGNOSIS — Z419 Encounter for procedure for purposes other than remedying health state, unspecified: Secondary | ICD-10-CM | POA: Diagnosis not present

## 2023-08-04 LAB — PROTEIN ELECTROPHORESIS, SERUM
A/G Ratio: 1.4 (ref 0.7–1.7)
Albumin ELP: 3.9 g/dL (ref 2.9–4.4)
Alpha-1-Globulin: 0.2 g/dL (ref 0.0–0.4)
Alpha-2-Globulin: 0.6 g/dL (ref 0.4–1.0)
Beta Globulin: 0.9 g/dL (ref 0.7–1.3)
Gamma Globulin: 1.1 g/dL (ref 0.4–1.8)
Globulin, Total: 2.8 g/dL (ref 2.2–3.9)
Total Protein ELP: 6.7 g/dL (ref 6.0–8.5)

## 2023-08-16 LAB — INTELLIGEN MYELOID

## 2023-08-29 DIAGNOSIS — H57813 Brow ptosis, bilateral: Secondary | ICD-10-CM | POA: Diagnosis not present

## 2023-08-29 DIAGNOSIS — H02834 Dermatochalasis of left upper eyelid: Secondary | ICD-10-CM | POA: Diagnosis not present

## 2023-08-29 DIAGNOSIS — H02831 Dermatochalasis of right upper eyelid: Secondary | ICD-10-CM | POA: Diagnosis not present

## 2023-09-02 ENCOUNTER — Inpatient Hospital Stay: Payer: Medicaid Other | Attending: Oncology | Admitting: Oncology

## 2023-09-02 DIAGNOSIS — D709 Neutropenia, unspecified: Secondary | ICD-10-CM

## 2023-09-02 NOTE — Progress Notes (Signed)
[facility]  Telephone:(336) [phone] Fax:(336) [phone]  ID: Diana Daniels OB: 05-21-1976  MR#: [id number]  GEX#:[id number]  Patient Care Team: Diana Baas, MD as PCP - General (Internal Medicine) Diana Ruths, MD as Consulting Physician (Oncology)  I connected with Diana Daniels on 09/02/23 at  9:30 AM EST by video enabled telemedicine visit and verified that I am speaking with the correct person using two identifiers.   I discussed the limitations, risks, security and privacy concerns of performing an evaluation and management service by telemedicine and the availability of in-person appointments. I also discussed with the patient that there may be a patient responsible charge related to this service. The patient expressed understanding and agreed to proceed.   Other persons participating in the visit and their role in the encounter: Patient, MD.  Patient's location: Home. Provider's location: Clinic.  CHIEF COMPLAINT: Chronic neutropenia.  INTERVAL HISTORY: Patient agreed to video-assisted telemedicine visit for further evaluation and discussion of her laboratory results.  She continues to feel well and remains asymptomatic.  She denies any repeated fevers or recent illnesses.  She has no neurologic complaints.  She has a good appetite and denies weight loss.  She has no chest pain, shortness of breath, cough, or hemoptysis.  She denies any nausea, vomiting, constipation, or diarrhea.  She has no urinary complaints.  Patient offers no specific complaints today.  REVIEW OF SYSTEMS:   Review of Systems  Constitutional: Negative.  Negative for fever, malaise/fatigue and weight loss.  Respiratory: Negative.  Negative for cough, hemoptysis and shortness of breath.   Cardiovascular: Negative.  Negative for chest pain and leg swelling.  Gastrointestinal: Negative.  Negative for abdominal pain.  Genitourinary: Negative.  Negative for dysuria.   Musculoskeletal: Negative.  Negative for back pain.  Skin: Negative.  Negative for rash.  Neurological: Negative.  Negative for dizziness, focal weakness, weakness and headaches.  Psychiatric/Behavioral: Negative.  The patient is not nervous/anxious.    As per HPI. Otherwise, a complete review of systems is negative.  PAST MEDICAL HISTORY: Past Medical History:  Diagnosis Date   Allergy    Anxiety    Attention deficit hyperactivity disorder (ADHD)    Celiac disease    Complication of anesthesia    Propofol intolerance   Depression    Factor 5 Leiden mutation, heterozygous (HCC)    Fibromyalgia    Hypoglycemia    Raynaud's disease     PAST SURGICAL HISTORY: Past Surgical History:  Procedure Laterality Date   COLONOSCOPY N/A 12/01/2017   Procedure: COLONOSCOPY;  Surgeon: Diana Mood, MD;  Location: [facility] ENDOSCOPY;  Service: Gastroenterology;  Laterality: N/A;  NO PROPOFOL   ESOPHAGOGASTRODUODENOSCOPY N/A 12/01/2017   Procedure: ESOPHAGOGASTRODUODENOSCOPY (EGD);  Surgeon: Diana Mood, MD;  Location: [facility] ENDOSCOPY;  Service: Gastroenterology;  Laterality: N/A;  NO PROPOFOL.   FOOT SURGERY      FAMILY HISTORY: Family History  Problem Relation Age of Onset   Clotting disorder Father    Cancer Maternal Grandmother    Breast cancer Maternal Grandmother    Stroke Paternal Grandmother    Kidney disease Paternal Grandfather     ADVANCED DIRECTIVES (Y/N):  N  HEALTH MAINTENANCE: Social History   Tobacco Use   Smoking status: Never   Smokeless tobacco: Never  Vaping Use   Vaping status: Never Used  Substance Use Topics   Alcohol use: Never   Drug use: Never     Colonoscopy:  PAP:  Bone density:  Lipid panel:  Allergies  Allergen Reactions   Gabapentin Other (See Comments)    Dizziness, double vision, really bad headaches, seemed to make her pain worse   Gluten Meal Diarrhea and Nausea And Vomiting   Propofol Other (See Comments)    Nervous System malfunction     Current Outpatient Medications  Medication Sig Dispense Refill   amphetamine-dextroamphetamine (ADDERALL) 20 MG tablet Take 1 tablet (20 mg total) by mouth 2 (two) times daily. 60 tablet 0   buPROPion (WELLBUTRIN) 100 MG tablet Take 100 mg by mouth daily.     calcium-vitamin D (OSCAL WITH D) 500-200 MG-UNIT tablet Take 2 tablets by mouth.     clonazePAM (KLONOPIN) 1 MG tablet Take 1 tablet (1 mg total) by mouth as needed for anxiety. 30 tablet 0   FLUoxetine (PROZAC) 10 MG tablet Take 10 mg by mouth daily.     FLUoxetine (PROZAC) 20 MG capsule Take 20 mg by mouth daily. (Patient not taking: Reported on 08/01/2023)     ibuprofen (ADVIL,MOTRIN) 600 MG tablet Take 1 tablet (600 mg total) by mouth every 6 (six) hours as needed. 30 tablet 0   Multiple Vitamin (MULTIVITAMIN) capsule Take 1 capsule by mouth daily.     rivaroxaban (XARELTO) 20 MG TABS tablet Take 1 tablet (20 mg total) by mouth daily. (Patient not taking: Reported on 08/01/2023) 30 tablet 1   No current facility-administered medications for this visit.    OBJECTIVE: There were no vitals filed for this visit.    There is no height or weight on file to calculate BMI.    ECOG FS:0 - Asymptomatic  General: Well-developed, well-nourished, no acute distress. HEENT: Normocephalic. Neuro: Alert, answering all questions appropriately. Cranial nerves grossly intact. Psych: Normal affect.   LAB RESULTS:  Lab Results  Component Value Date   NA 137 07/28/2017   K 4.3 07/28/2017   CL 97 07/28/2017   CO2 26 07/28/2017   GLUCOSE 85 07/28/2017   BUN 9 07/28/2017   CREATININE 0.65 07/28/2017   CALCIUM 9.5 07/28/2017   PROT 7.1 07/28/2017   ALBUMIN 4.5 07/28/2017   AST 18 07/28/2017   ALT 13 07/28/2017   ALKPHOS 76 07/28/2017   BILITOT 0.4 07/28/2017   GFRNONAA 110 07/28/2017   GFRAA 127 07/28/2017    Lab Results  Component Value Date   WBC 3.6 (L) 08/01/2023   NEUTROABS 1.2 (L) 03/21/2018   HGB 13.7 08/01/2023   HCT  40.0 08/01/2023   MCV 92.6 08/01/2023   PLT 187 08/01/2023     STUDIES: No results found.  ASSESSMENT: Chronic neutropenia.  PLAN:    Chronic neutropenia: Patient's total white blood cell count and neutrophil count remains decreased, but essentially unchanged since September 2010.  Her most recent white blood cell count today was 3.6.  Bone marrow biopsy in 2010 did not reveal any significant pathology.  Once again, all of her laboratory work is either negative or within normal limits.  [employer] myeloid panel noted a mutation possibly suggestive of underlying MDS, but a repeat bone marrow biopsy would be necessary to confirm which is not necessary at this point.  No intervention is needed at this time.  Patient does not require treatment.  No follow-up has been scheduled.  Please refer patient back if there are any questions or concerns.   Health maintenance: Okay to proceed with colonoscopy as scheduled.  Patient does not require Neulasta or other treatments prophylactically.  I provided 20 minutes of face-to-face video visit time during this  encounter which included chart review, counseling, and coordination of care as documented above.   Patient expressed understanding and was in agreement with this plan. She also understands that She can call clinic at any time with any questions, concerns, or complaints.    Diana Ruths, MD   09/02/2023 9:21 AM

## 2023-09-03 DIAGNOSIS — Z419 Encounter for procedure for purposes other than remedying health state, unspecified: Secondary | ICD-10-CM | POA: Diagnosis not present

## 2023-10-01 DIAGNOSIS — Z419 Encounter for procedure for purposes other than remedying health state, unspecified: Secondary | ICD-10-CM | POA: Diagnosis not present

## 2023-10-07 DIAGNOSIS — F411 Generalized anxiety disorder: Secondary | ICD-10-CM | POA: Diagnosis not present

## 2023-10-07 DIAGNOSIS — F900 Attention-deficit hyperactivity disorder, predominantly inattentive type: Secondary | ICD-10-CM | POA: Diagnosis not present

## 2023-10-07 DIAGNOSIS — F4001 Agoraphobia with panic disorder: Secondary | ICD-10-CM | POA: Diagnosis not present

## 2023-10-07 DIAGNOSIS — F332 Major depressive disorder, recurrent severe without psychotic features: Secondary | ICD-10-CM | POA: Diagnosis not present

## 2023-11-12 DIAGNOSIS — Z419 Encounter for procedure for purposes other than remedying health state, unspecified: Secondary | ICD-10-CM | POA: Diagnosis not present

## 2023-11-22 DIAGNOSIS — H02831 Dermatochalasis of right upper eyelid: Secondary | ICD-10-CM | POA: Diagnosis not present

## 2023-11-22 DIAGNOSIS — H02834 Dermatochalasis of left upper eyelid: Secondary | ICD-10-CM | POA: Diagnosis not present

## 2023-12-12 DIAGNOSIS — Z419 Encounter for procedure for purposes other than remedying health state, unspecified: Secondary | ICD-10-CM | POA: Diagnosis not present

## 2023-12-15 DIAGNOSIS — N898 Other specified noninflammatory disorders of vagina: Secondary | ICD-10-CM | POA: Diagnosis not present

## 2023-12-15 DIAGNOSIS — M797 Fibromyalgia: Secondary | ICD-10-CM | POA: Diagnosis not present

## 2023-12-15 DIAGNOSIS — H02409 Unspecified ptosis of unspecified eyelid: Secondary | ICD-10-CM | POA: Diagnosis not present

## 2023-12-15 DIAGNOSIS — G44221 Chronic tension-type headache, intractable: Secondary | ICD-10-CM | POA: Diagnosis not present

## 2023-12-15 DIAGNOSIS — D6851 Activated protein C resistance: Secondary | ICD-10-CM | POA: Diagnosis not present

## 2023-12-15 DIAGNOSIS — Z124 Encounter for screening for malignant neoplasm of cervix: Secondary | ICD-10-CM | POA: Diagnosis not present

## 2023-12-30 DIAGNOSIS — F411 Generalized anxiety disorder: Secondary | ICD-10-CM | POA: Diagnosis not present

## 2023-12-30 DIAGNOSIS — F4001 Agoraphobia with panic disorder: Secondary | ICD-10-CM | POA: Diagnosis not present

## 2023-12-30 DIAGNOSIS — F900 Attention-deficit hyperactivity disorder, predominantly inattentive type: Secondary | ICD-10-CM | POA: Diagnosis not present

## 2023-12-30 DIAGNOSIS — F332 Major depressive disorder, recurrent severe without psychotic features: Secondary | ICD-10-CM | POA: Diagnosis not present

## 2024-01-12 DIAGNOSIS — Z419 Encounter for procedure for purposes other than remedying health state, unspecified: Secondary | ICD-10-CM | POA: Diagnosis not present

## 2024-02-11 DIAGNOSIS — Z419 Encounter for procedure for purposes other than remedying health state, unspecified: Secondary | ICD-10-CM | POA: Diagnosis not present

## 2024-03-13 DIAGNOSIS — Z419 Encounter for procedure for purposes other than remedying health state, unspecified: Secondary | ICD-10-CM | POA: Diagnosis not present

## 2024-03-29 DIAGNOSIS — F900 Attention-deficit hyperactivity disorder, predominantly inattentive type: Secondary | ICD-10-CM | POA: Diagnosis not present

## 2024-03-29 DIAGNOSIS — F4001 Agoraphobia with panic disorder: Secondary | ICD-10-CM | POA: Diagnosis not present

## 2024-03-29 DIAGNOSIS — F332 Major depressive disorder, recurrent severe without psychotic features: Secondary | ICD-10-CM | POA: Diagnosis not present

## 2024-04-13 DIAGNOSIS — Z419 Encounter for procedure for purposes other than remedying health state, unspecified: Secondary | ICD-10-CM | POA: Diagnosis not present

## 2024-05-01 DIAGNOSIS — H57813 Brow ptosis, bilateral: Secondary | ICD-10-CM | POA: Diagnosis not present

## 2024-05-01 DIAGNOSIS — H538 Other visual disturbances: Secondary | ICD-10-CM | POA: Diagnosis not present

## 2024-05-01 DIAGNOSIS — H0259 Other disorders affecting eyelid function: Secondary | ICD-10-CM | POA: Diagnosis not present

## 2024-05-01 DIAGNOSIS — Z9889 Other specified postprocedural states: Secondary | ICD-10-CM | POA: Diagnosis not present

## 2024-05-09 DIAGNOSIS — H532 Diplopia: Secondary | ICD-10-CM | POA: Diagnosis not present

## 2024-05-09 DIAGNOSIS — H04123 Dry eye syndrome of bilateral lacrimal glands: Secondary | ICD-10-CM | POA: Diagnosis not present

## 2024-06-13 DIAGNOSIS — Z419 Encounter for procedure for purposes other than remedying health state, unspecified: Secondary | ICD-10-CM | POA: Diagnosis not present

## 2024-06-18 DIAGNOSIS — H532 Diplopia: Secondary | ICD-10-CM | POA: Diagnosis not present

## 2024-06-18 DIAGNOSIS — H5021 Vertical strabismus, right eye: Secondary | ICD-10-CM | POA: Diagnosis not present

## 2024-06-20 DIAGNOSIS — F4001 Agoraphobia with panic disorder: Secondary | ICD-10-CM | POA: Diagnosis not present

## 2024-06-20 DIAGNOSIS — F900 Attention-deficit hyperactivity disorder, predominantly inattentive type: Secondary | ICD-10-CM | POA: Diagnosis not present

## 2024-07-13 DIAGNOSIS — Z419 Encounter for procedure for purposes other than remedying health state, unspecified: Secondary | ICD-10-CM | POA: Diagnosis not present

## 2024-07-20 DIAGNOSIS — Z0000 Encounter for general adult medical examination without abnormal findings: Secondary | ICD-10-CM | POA: Diagnosis not present

## 2024-08-01 DIAGNOSIS — Z0000 Encounter for general adult medical examination without abnormal findings: Secondary | ICD-10-CM | POA: Diagnosis not present

## 2024-08-01 DIAGNOSIS — Z1331 Encounter for screening for depression: Secondary | ICD-10-CM | POA: Diagnosis not present

## 2024-08-03 ENCOUNTER — Other Ambulatory Visit: Payer: Self-pay | Admitting: Internal Medicine

## 2024-08-03 DIAGNOSIS — N924 Excessive bleeding in the premenopausal period: Secondary | ICD-10-CM

## 2024-08-03 DIAGNOSIS — Z1231 Encounter for screening mammogram for malignant neoplasm of breast: Secondary | ICD-10-CM

## 2024-08-06 ENCOUNTER — Other Ambulatory Visit: Payer: Self-pay | Admitting: Internal Medicine

## 2024-08-06 ENCOUNTER — Ambulatory Visit

## 2024-08-06 DIAGNOSIS — R0602 Shortness of breath: Secondary | ICD-10-CM

## 2024-08-06 DIAGNOSIS — Z0000 Encounter for general adult medical examination without abnormal findings: Secondary | ICD-10-CM

## 2024-08-06 DIAGNOSIS — D6851 Activated protein C resistance: Secondary | ICD-10-CM

## 2024-08-29 ENCOUNTER — Ambulatory Visit: Admission: RE | Admit: 2024-08-29 | Source: Ambulatory Visit

## 2024-09-17 ENCOUNTER — Ambulatory Visit

## 2024-09-26 ENCOUNTER — Ambulatory Visit
